# Patient Record
Sex: Male | Born: 1990
Health system: Southern US, Community
[De-identification: ages and names within clinical notes are randomized; demographics above are authoritative.]

## PROBLEM LIST (undated history)

## (undated) DIAGNOSIS — I10 Essential (primary) hypertension: Secondary | ICD-10-CM

## (undated) DIAGNOSIS — E119 Type 2 diabetes mellitus without complications: Secondary | ICD-10-CM

---

## 2010-08-16 ENCOUNTER — Emergency Department (HOSPITAL_COMMUNITY): Admission: EM | Admit: 2010-08-16 | Discharge: 2010-08-16 | Payer: Self-pay | Admitting: Emergency Medicine

## 2013-01-25 ENCOUNTER — Encounter: Payer: Self-pay | Admitting: Medical

## 2013-02-23 ENCOUNTER — Encounter: Payer: Self-pay | Admitting: Medical

## 2015-09-04 ENCOUNTER — Encounter (HOSPITAL_COMMUNITY): Payer: Self-pay | Admitting: *Deleted

## 2015-09-04 ENCOUNTER — Emergency Department (HOSPITAL_COMMUNITY)
Admission: EM | Admit: 2015-09-04 | Discharge: 2015-09-04 | Disposition: A | Discharge status: Home / Self Care | Payer: PRIVATE HEALTH INSURANCE | Attending: Emergency Medicine | Admitting: Emergency Medicine

## 2015-09-04 DIAGNOSIS — H10212 Acute toxic conjunctivitis, left eye: Secondary | ICD-10-CM | POA: Insufficient documentation

## 2015-09-04 DIAGNOSIS — T6591XA Toxic effect of unspecified substance, accidental (unintentional), initial encounter: Secondary | ICD-10-CM | POA: Diagnosis not present

## 2015-09-04 DIAGNOSIS — E119 Type 2 diabetes mellitus without complications: Secondary | ICD-10-CM | POA: Diagnosis not present

## 2015-09-04 DIAGNOSIS — Z77098 Contact with and (suspected) exposure to other hazardous, chiefly nonmedicinal, chemicals: Secondary | ICD-10-CM | POA: Diagnosis present

## 2015-09-04 DIAGNOSIS — I10 Essential (primary) hypertension: Secondary | ICD-10-CM | POA: Insufficient documentation

## 2015-09-04 HISTORY — DX: Essential (primary) hypertension: I10

## 2015-09-04 HISTORY — DX: Type 2 diabetes mellitus without complications: E11.9

## 2015-09-04 MED ORDER — ERYTHROMYCIN 5 MG/GM OP OINT
TOPICAL_OINTMENT | Freq: Three times a day (TID) | OPHTHALMIC | Status: DC
  Administered 2015-09-04: 02:00:00 via OPHTHALMIC
  Filled 2015-09-04: qty 3.5

## 2015-09-04 MED ORDER — PROPARACAINE HCL 0.5 % OP SOLN
1.0000 [drp] | Freq: Once | OPHTHALMIC | Status: AC
  Administered 2015-09-04: 1 [drp] via OPHTHALMIC
  Filled 2015-09-04: qty 15

## 2015-09-04 MED ORDER — FLUORESCEIN SODIUM 1 MG OP STRP
2.0000 | ORAL_STRIP | Freq: Once | OPHTHALMIC | Status: AC
  Administered 2015-09-04: 2 via OPHTHALMIC
  Filled 2015-09-04: qty 2

## 2015-09-04 NOTE — ED Notes (Signed)
Pt states he was shaking the bottle of cleanser to make sure it was something in there and the lid was off and some of the chemical got in his left eye and now his left eye feels scratchy,  This was an abrasive cleanser that is used to clean sinks and toilets.  Pt denies pain,  Just scratchy feeling   ,  Pt did flush his eyes twice,  He is an employee in environmental services here

## 2015-09-04 NOTE — Discharge Instructions (Signed)
Chemical Conjunctivitis Chemical conjunctivitis is an irritation of the underside of the eyelid and the white part of the eye. Conjunctivitis can be caused by infection, allergy or chemical irritation. In your case it has been caused by a chemical irritation of the eye. Symptoms almost always include: tearing, light sensitivity, gritty feeling (sensation) in the eyes, swelling of your eyelids, and often severe pain. In spite of the severe pain, this irritation will run its course and will improve within 24 hours.  HOME CARE INSTRUCTIONS   To ease discomfort apply a cool, clean wash cloth to your eye for 10 to 20 minutes, 3 to 4 times per day.  Do not rub your eyes.  Gently wipe away any discharge from the eyes with moistened tissues.  Wash your hands often with soap and use paper towels to dry.  Sunglasses may be helpful if light bothers your eyes.  Do not use eye make-up.  Do not use contact lenses until the irritation is gone.  Do not operate machinery or drive if your vision is blurred.  Take medications as directed by your caregiver. Artificial tears may ease discomfort.  Avoid the chemical or surroundings which caused the problem. Always use eye protection as necessary. SEEK MEDICAL CARE IF:   The eye is still pink (inflamed) 3 days after beginning treatment.  Pain in the eye increases.  You have discharge coming from either eye.  Your eyelids are stuck together in the morning.  You have an increased sensitivity to light.  An oral temperature above 102 F (38.9 C) develops.  You develop facial pain.  You have any problems that may be related to the medicine you are taking. SEEK IMMEDIATE MEDICAL CARE IF:   Your vision is getting worse.  You develop severe eye pain. MAKE SURE YOU:   Understand these instructions.  Will watch your condition.  Will get help right away if you are not doing well or get worse. Document Released: 09/10/2005 Document Revised:  02/23/2012 Document Reviewed: 07/19/2008 ExitCare Patient Information 2015 [employer]. This information is not intended to replace advice given to you by your health care provider. Make sure you discuss any questions you have with your health care provider.  

## 2015-09-04 NOTE — ED Provider Notes (Signed)
CSN: [id number]     Arrival date & time 09/04/15  0035 History   First MD Initiated Contact with Patient 09/04/15 0047     Chief Complaint  Patient presents with  . Chemical Exposure     (Consider location/radiation/quality/duration/timing/severity/associated sxs/prior Treatment) Patient is a 24 y.o. male presenting with eye injury. The history is provided by the patient. No language interpreter was used.  Eye Injury This is a new problem. The current episode started today. The problem occurs constantly. Pertinent negatives include no congestion. 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 It was flushed at the time of the injury for about 5-6 minutes and he felt better afterward, with less pain but continued tearing. No visual change. .    Past Medical History  Diagnosis Date  . Diabetes mellitus without complication   . Hypertension    History reviewed. No pertinent past surgical history. History reviewed. No pertinent family history. Social History  Substance Use Topics  . Smoking status: Never Smoker   . Smokeless tobacco: None  . Alcohol Use: No    Review of Systems  HENT: Negative for congestion and rhinorrhea.   Eyes: Positive for pain and redness. Negative for photophobia and visual disturbance.      Allergies  Shellfish allergy  Home Medications   Prior to Admission medications   Not on File   BP 141/81 mmHg  Pulse 72  Temp(Src) 98.7 F (37.1 C) (Oral)  Resp 20  Ht  (1.854 m)  Wt 315 lb (142.883 kg)  BMI 41.57 kg/m2  SpO2 96% Physical Exam  Constitutional: He appears well-developed and well-nourished. No distress.  Eyes:  Left eye has conjunctival redness without discharge or swelling. No hyphema. No fluorescein uptake indicating abrasion or ulceration. Left eye pH = 7. Lids unremarkable.   Skin: He is not diaphoretic.    ED  Course  Procedures (including critical care time) Labs Review Labs Reviewed - No data to display  Imaging Review No results found. I have personally reviewed and evaluated these images and lab results as part of my medical decision-making.   EKG Interpretation None      MDM   Final diagnoses:  None    1. Chemical conjunctivitis  Will provide erythromycin ointment to prevent infection. Ophthalmologic referral if symptoms worsen.     Elpidio Anis, PA-C 09/04/15 0201  Azalia Bilis, MD 09/04/15 [phone]

## 2015-09-04 NOTE — ED Notes (Signed)
[place] light and items at bedside

## 2016-02-22 DIAGNOSIS — Z0000 Encounter for general adult medical examination without abnormal findings: Secondary | ICD-10-CM | POA: Diagnosis not present

## 2016-02-22 DIAGNOSIS — E119 Type 2 diabetes mellitus without complications: Secondary | ICD-10-CM | POA: Diagnosis not present

## 2016-02-22 MED FILL — metFORMIN HCL 500 MG TABS: 500 | 30 days supply | Qty: 60 | Fill #0

## 2016-02-28 DIAGNOSIS — Z6841 Body Mass Index (BMI) 40.0 and over, adult: Secondary | ICD-10-CM | POA: Diagnosis not present

## 2016-02-28 DIAGNOSIS — E119 Type 2 diabetes mellitus without complications: Secondary | ICD-10-CM | POA: Diagnosis not present

## 2016-02-28 DIAGNOSIS — Z13220 Encounter for screening for lipoid disorders: Secondary | ICD-10-CM | POA: Diagnosis not present

## 2016-02-28 DIAGNOSIS — Z0000 Encounter for general adult medical examination without abnormal findings: Secondary | ICD-10-CM | POA: Diagnosis not present

## 2016-02-28 DIAGNOSIS — Z7984 Long term (current) use of oral hypoglycemic drugs: Secondary | ICD-10-CM | POA: Diagnosis not present

## 2016-06-29 ENCOUNTER — Emergency Department (HOSPITAL_COMMUNITY)
Admission: EM | Admit: 2016-06-29 | Discharge: 2016-06-29 | Disposition: A | Discharge status: Home / Self Care | Payer: 59 | Attending: Physician Assistant | Admitting: Physician Assistant

## 2016-06-29 ENCOUNTER — Encounter (HOSPITAL_COMMUNITY): Payer: Self-pay

## 2016-06-29 DIAGNOSIS — S3992XA Unspecified injury of lower back, initial encounter: Secondary | ICD-10-CM | POA: Diagnosis present

## 2016-06-29 DIAGNOSIS — Y9389 Activity, other specified: Secondary | ICD-10-CM | POA: Diagnosis not present

## 2016-06-29 DIAGNOSIS — I10 Essential (primary) hypertension: Secondary | ICD-10-CM | POA: Insufficient documentation

## 2016-06-29 DIAGNOSIS — Y92410 Unspecified street and highway as the place of occurrence of the external cause: Secondary | ICD-10-CM | POA: Diagnosis not present

## 2016-06-29 DIAGNOSIS — Y999 Unspecified external cause status: Secondary | ICD-10-CM | POA: Diagnosis not present

## 2016-06-29 DIAGNOSIS — S39012A Strain of muscle, fascia and tendon of lower back, initial encounter: Secondary | ICD-10-CM | POA: Diagnosis not present

## 2016-06-29 DIAGNOSIS — E119 Type 2 diabetes mellitus without complications: Secondary | ICD-10-CM | POA: Insufficient documentation

## 2016-06-29 MED ORDER — CYCLOBENZAPRINE HCL 10 MG PO TABS: 10.0000 mg | ORAL_TABLET | Freq: Two times a day (BID) | ORAL | Status: DC | PRN

## 2016-06-29 MED ORDER — HYDROCODONE-ACETAMINOPHEN 5-325 MG PO TABS: 1.0000 | ORAL_TABLET | ORAL | Status: DC | PRN
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
## 2016-06-29 NOTE — ED Notes (Signed)
PT DISCHARGED. INSTRUCTIONS AND PRESCRIPTIONS GIVEN. AAOX4. PT IN NO APPARENT DISTRESS. THE OPPORTUNITY TO ASK QUESTIONS WAS PROVIDED. 

## 2016-07-10 MED FILL — metFORMIN HCL 500 MG TABS: 500 | 30 days supply | Qty: 60 | Fill #1

## 2016-10-24 DIAGNOSIS — Z7984 Long term (current) use of oral hypoglycemic drugs: Secondary | ICD-10-CM | POA: Diagnosis not present

## 2016-10-24 DIAGNOSIS — E119 Type 2 diabetes mellitus without complications: Secondary | ICD-10-CM | POA: Diagnosis not present

## 2017-01-09 MED FILL — metFORMIN HCL 500 MG TABS: 500 | 90 days supply | Qty: 180 | Fill #0

## 2017-01-19 ENCOUNTER — Ambulatory Visit: Payer: Self-pay | Admitting: Endocrinology

## 2017-02-04 ENCOUNTER — Encounter: Payer: Self-pay | Admitting: Endocrinology

## 2017-02-04 ENCOUNTER — Ambulatory Visit (INDEPENDENT_AMBULATORY_CARE_PROVIDER_SITE_OTHER): Payer: 59 | Admitting: Endocrinology

## 2017-02-04 ENCOUNTER — Other Ambulatory Visit: Payer: Self-pay

## 2017-02-04 VITALS — BP 126/78 | HR 89 | Ht 73.0 in | Wt 272.0 lb

## 2017-02-04 DIAGNOSIS — E1165 Type 2 diabetes mellitus with hyperglycemia: Secondary | ICD-10-CM | POA: Diagnosis not present

## 2017-02-04 DIAGNOSIS — N62 Hypertrophy of breast: Secondary | ICD-10-CM

## 2017-02-04 LAB — POCT GLUCOSE (DEVICE FOR HOME USE): GLUCOSE FASTING, POC: 304 mg/dL — AB (ref 70–99)

## 2017-02-04 LAB — POCT GLYCOSYLATED HEMOGLOBIN (HGB A1C): HEMOGLOBIN A1C: 13.3

## 2017-02-04 MED ORDER — INSULIN GLARGINE-LIXISENATIDE 100-33 UNT-MCG/ML ~~LOC~~ SOPN: 30.0000 [IU] | PEN_INJECTOR | Freq: Every day | SUBCUTANEOUS | 1 refills | Status: DC

## 2017-02-04 MED ORDER — INSULIN PEN NEEDLE 31G X 5 MM MISC: 4 refills | Status: DC

## 2017-02-04 MED ORDER — GLUCOSE BLOOD VI STRP: ORAL_STRIP | 5 refills | Status: DC

## 2017-02-04 MED ORDER — METFORMIN HCL 1000 MG PO TABS: 1000.0000 mg | ORAL_TABLET | Freq: Two times a day (BID) | ORAL | 3 refills | Status: DC

## 2017-02-04 MED ORDER — ACCU-CHEK MULTICLIX LANCETS MISC: 12 refills | Status: DC

## 2017-02-04 MED FILL — metFORMIN HCL 1000 MG TABS: 1000 | 30 days supply | Qty: 60 | Fill #0

## 2017-02-04 NOTE — Progress Notes (Signed)
Patient ID: Danny Crawford, male   DOB: 1991-04-20, 26 y.o.   MRN: [id number]            Reason for Appointment: Consultation for Type 2 Diabetes  Referring physician: Manon Hilding   History of Present Illness:          Date of diagnosis of type 2 diabetes mellitus:        Background history:   At the time of diagnosis she had symptoms of excessive urination, fatigue and blurred vision He thinks he was given an insulin injection once a day but since blood sugars came down soon he was switched to metformin, this was done out of town and no records are available His A1c was 6.3 in 3/17 done by his PCP but he had been irregular with follow-up subsequently  Recent history:    Non-insulin hypoglycemic drugs the patient is taking are: Metformin 500 mg twice a day  Current management, blood sugar patterns and problems identified:  He was referred in 11/17 when his A1c was 14.9 with his PCP but he is now coming for evaluation  He had lost 30-40 pounds late last year but the weight is now leveled off     He says he does not have test strips to check her sugar with his One Touch monitor and has checked his blood sugar sporadically.  Even though he thinks blood sugars last November were over 200 they are not as high now, did not bring his monitor for review today  He has not been following any  specific diet and generally eating large portions and not restricting carbohydrate or fat  Glucose in the office today after a light breakfast was 304  He does not think he has any increased thirst or urination, also is usually drinking sugar-free drinks        Side effects from medications have been: None  Compliance with the medical regimen: Poor  Glucose monitoring:  done <1  times a day         Glucometer: One Touch.       Blood Glucose readings by time of day and averages from recall   PREMEAL Breakfast Lunch Dinner Bedtime  Overall   Glucose range:  160-190  160-190   Median:          Self-care:   Typical meal intake: Breakfast is oatmeal, pancackes Lunch and dinner: Chicken, hamburger, vegetables etc.               Dietician visit, most recent: None               Exercise:  4 times a week at [facility], he is doing some weights and running  Weight history: 271-350, losing since 8/17  Wt Readings from Last 3 Encounters:  02/04/17 272 lb (123.4 kg)  09/04/15 (!) 315 lb (142.9 kg)    Glycemic control:    Lab Results  Component Value Date   HGBA1C 13.3 02/04/2017   No results found for: GLUF, MICROALBUR, LDLCALC, CREATININE No results found for: MICRALBCREAT     Allergies as of 02/04/2017      Reactions   Shellfish Allergy       Medication List       Accurate as of 02/04/17  1:10 PM. Always use your most recent med list.          accu-chek multiclix lancets Use to test blood sugar 2 times daily   glucose blood test strip Commonly known as:  ACCU-CHEK AVIVA PLUS Use to test blood sugar 2 times daily   Insulin Glargine-Lixisenatide 100-33 UNT-MCG/ML Sopn Commonly known as:  SOLIQUA Inject 30 Units into the skin daily before breakfast.   Insulin Pen Needle 31G X 5 MM Misc Use to inject insulin twice daily   metFORMIN 1000 MG tablet Commonly known as:  GLUCOPHAGE Take 1 tablet (1,000 mg total) by mouth 2 (two) times daily with a meal.       Allergies:  Allergies  Allergen Reactions  . Shellfish Allergy     Past Medical History:  Diagnosis Date  . Diabetes mellitus without complication (HCC)   . Hypertension     No past surgical history on file.  Family History  Problem Relation Age of Onset  . Diabetes Father   . Hypertension Father     Social History:  reports that he has quit smoking. He has never used smokeless tobacco. He reports that he drinks alcohol. He reports that he does not use drugs.   Review of Systems  Constitutional: Negative for weight loss and reduced appetite.  Eyes: Negative for blurred vision.   Respiratory: Negative for shortness of breath.   Cardiovascular: Negative for leg swelling.  Gastrointestinal: Negative for nausea and diarrhea.  Endocrine: Negative for fatigue, decreased libido and erectile dysfunction.  Genitourinary: Negative for frequency and nocturia.  Musculoskeletal: Negative for joint pain.  Skin: Negative for rash.  Neurological: Negative for numbness and tingling.  Psychiatric/Behavioral: Negative for insomnia.     Lipid history: No labs available   No results found for: CHOL, HDL, LDLCALC, LDLDIRECT, TRIG, CHOLHDL         Hypertension: Has had previous diagnosis of hypertension but not on treatment now  Most recent eye exam was in 2016  Most recent foot exam: 2/18    LABS:  Office Visit on 02/04/2017  Component Date Value Ref Range Status  . Hemoglobin A1C 02/04/2017 13.3   Final  . Glucose Fasting, POC 02/04/2017 304* 70 - 99 mg/dL Final    Physical Examination:  BP 126/78   Pulse 89   Ht 6\' 1"  (1.854 m)   Wt 272 lb (123.4 kg)   SpO2 95%   BMI 35.89 kg/m   GENERAL:     Well-built and nourished, heavy set, mildly generalized obesity      HEENT:         Eye exam shows normal external appearance. Fundus exam shows no retinopathy.  Oral exam shows normal mucosa .  NECK:   There is no lymphadenopathy  Thyroid is not enlarged and no nodules felt.  Carotids are normal to palpation and no bruit heard CHEST: Bilateral gynecomastia present, more on the left LUNGS:         Chest is symmetrical. Lungs are clear to auscultation.Marland Kitchen   HEART:         Heart sounds:  S1 and S2 are normal. No murmur or click heard., no S3 or S4.   ABDOMEN:   There is no distention present. Liver and spleen are not palpable. No other mass or tenderness present.   NEUROLOGICAL:   Ankle jerks are absent bilaterally.    Diabetic Foot Exam - Simple   Simple Foot Form Diabetic Foot exam was performed with the following findings:  Yes 02/04/2017 12:13 PM  Visual  Inspection No deformities, no ulcerations, no other skin breakdown bilaterally:  Yes Sensation Testing Intact to touch and monofilament testing bilaterally:  Yes Pulse Check Posterior Tibialis and Dorsalis pulse  intact bilaterally:  Yes Comments            Vibration sense is Normal in distal first toes.  MUSCULOSKELETAL:  There is no swelling or deformity of the peripheral joints.  EXTREMITIES:     There is no edema. No skin lesions present.Marland Kitchen SKIN:     he has acanthosis of his neck.     No rash on extremities or lesions of concern.        ASSESSMENT:  Diabetes type 2, uncontrolled    He had apparently symptomatic hyperglycemia at onset requiring insulin and although he was able to get good control with metformin alone a year ago his blood sugars are markedly increased for at least 6 months now Even though he was supposed to be starting insulin last July when his A1c was 14.9 this was not implemented and he has not come for his consultation until now He still has marked hyperglycemia with A1c over 13% although surprisingly asymptomatic His only on low-dose metformin which is not effective  He has no functioning meter at home and his blood sugars reportedly are below 200 which indicates either expired test strips or faulty meter per He does not restrict carbohydrates or fats and his diet Does exercise regularly and has been able to keep his weight down for the last few months  No history of hypertension or hyperlipidemia  Complications of diabetes: None evident  Morbid obesity, gynecomastia, 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  This will provide both basal insulin to reduce glucose toxicity as well as a GLP-1 drug to improve endogenous insulin and help with satiety and weight loss.  Explained to him how GLP-1 drugs work.  Also discussed possible side effects of GLP-1 drugs including nausea and the need to titrate  gradually  Showed him how to use the injection pen device, discussed injection sites and timing of injection to be used before breakfast daily  Discussed action of basal insulin and titration schedule  He will start with 16 units and increase the dose by 2 units every 3 days as long as he has no nausea and try to get morning sugars at least below 150  He needs to start using Accu-Chek monitor which is his company preferred monitor and showed him how to use this as well as need for checking at least once a day at various times  Increased metformin to 1 g twice a day  Discussed blood sugar targets at various times  Cut back on carbohydrates and fat  Consultation with diabetes educator next week  Also have recommended that he signed up for her wellness program with his work although he thinks he is too busy to do this  Patient Instructions    SOLIQUA : This HAS insulin provides blood sugar control for up to 24 hours.   Start with 16 units at am daily and increase by 2 units every 3 days until the waking up sugars are under 150. Then continue the same dose.  If blood sugar is under 100 for 2 days in a row, reduce the dose by 2 units.   Note that this insulin does not control the rise of blood sugar with meals    Check blood sugars on waking up  daily  Also check blood sugars about 2 hours after a meal and do this after different meals by rotation  Recommended blood sugar levels on waking up is  90-130 and about 2 hours after meal is 130-160  Please bring your blood sugar monitor to each visit, thank you    Counseling time on subjects discussed above is over 50% of today's 60 minute visit   Consultation note has been sent to the referring physician  [facility] I[facility] PaKUMAR,Tyshon Fanning 02/04/2017, 1:10 PM   Note: This office note was prepared with Dragon voice recognition system technology. Any transcriptional errors that result from this process are unintentional.

## 2017-02-04 NOTE — Patient Instructions (Signed)
SOLIQUA : This HAS insulin provides blood sugar control for up to 24 hours.   Start with 16 units at am daily and increase by 2 units every 3 days until the waking up sugars are under 150. Then continue the same dose.  If blood sugar is under 100 for 2 days in a row, reduce the dose by 2 units.   Note that this insulin does not control the rise of blood sugar with meals    Check blood sugars on waking up  daily  Also check blood sugars about 2 hours after a meal and do this after different meals by rotation  Recommended blood sugar levels on waking up is 90-130 and about 2 hours after meal is 130-160  Please bring your blood sugar monitor to each visit, thank you

## 2017-02-05 ENCOUNTER — Other Ambulatory Visit: Payer: Self-pay

## 2017-02-05 ENCOUNTER — Other Ambulatory Visit: Payer: Self-pay | Admitting: Endocrinology

## 2017-02-05 MED ORDER — GLUCOSE BLOOD VI STRP: ORAL_STRIP | 5 refills | Status: DC

## 2017-02-05 MED ORDER — FREESTYLE LANCETS MISC: 5 refills | Status: DC

## 2017-02-05 MED ORDER — FREESTYLE LITE DEVI: 1 refills | Status: DC

## 2017-02-05 MED ORDER — INSULIN GLARGINE-LIXISENATIDE 100-33 UNT-MCG/ML ~~LOC~~ SOPN: 30.0000 [IU] | PEN_INJECTOR | Freq: Every day | SUBCUTANEOUS | 1 refills | Status: DC

## 2017-02-05 MED FILL — FREESTYLE LANCETS: 50 days supply | Qty: 100 | Fill #0

## 2017-02-05 MED FILL — FREESTYLE LITE TEST STRIP: 50 days supply | Qty: 100 | Fill #0

## 2017-02-05 MED FILL — FREESTYLE LITE METER: 1 days supply | Qty: 1 | Fill #0

## 2017-02-11 ENCOUNTER — Encounter: Payer: 59 | Attending: Endocrinology | Admitting: Nutrition

## 2017-02-11 DIAGNOSIS — E1165 Type 2 diabetes mellitus with hyperglycemia: Secondary | ICD-10-CM | POA: Insufficient documentation

## 2017-02-11 DIAGNOSIS — Z713 Dietary counseling and surveillance: Secondary | ICD-10-CM | POA: Diagnosis not present

## 2017-02-12 NOTE — Progress Notes (Signed)
Mr. Chimento was shown how to use the NigerSoliqua pen, and we discussed how and when to take this. We also discussed how the  long acting insulin works, as well as how to GLP1 works to lower his blood sugar.  He reported good understanding of all topics He was given some sample 5mm pen needles to use with this.  We dissussed the side effects of this drug and what to do for this. We also discussed the dosage and dosage adjustment schedule. Written instructions were given for 16u, wait 3 days, if FBSs are over 150, increase the dose by 2u.  He will continue to increase the dose by 2u q 3 days until FBS is less than  He reported good understanding of this, and  had no final questions

## 2017-02-12 NOTE — Patient Instructions (Signed)
Take 16u once a day. After 3 days, if morning blood sugars are over 150, increase the dose by 2 units.  Continue to increase the dose by 2units every 3 days until the morning blood sugars are less than 150.

## 2017-02-13 ENCOUNTER — Other Ambulatory Visit: Payer: Self-pay | Admitting: Endocrinology

## 2017-02-13 DIAGNOSIS — E1165 Type 2 diabetes mellitus with hyperglycemia: Secondary | ICD-10-CM

## 2017-02-19 ENCOUNTER — Other Ambulatory Visit: Payer: Self-pay

## 2017-02-19 MED ORDER — INSULIN GLARGINE 100 UNIT/ML SOLOSTAR PEN: 20.0000 [IU] | PEN_INJECTOR | Freq: Every day | SUBCUTANEOUS | 2 refills | Status: DC

## 2017-02-19 MED ORDER — LIRAGLUTIDE 18 MG/3ML ~~LOC~~ SOPN: PEN_INJECTOR | SUBCUTANEOUS | 3 refills | Status: DC

## 2017-02-24 ENCOUNTER — Other Ambulatory Visit (INDEPENDENT_AMBULATORY_CARE_PROVIDER_SITE_OTHER): Payer: 59

## 2017-02-24 DIAGNOSIS — N62 Hypertrophy of breast: Secondary | ICD-10-CM

## 2017-02-24 DIAGNOSIS — E1165 Type 2 diabetes mellitus with hyperglycemia: Secondary | ICD-10-CM

## 2017-02-24 LAB — LIPID PANEL
Cholesterol: 127 mg/dL (ref 0–200)
HDL: 33.6 mg/dL — AB (ref >39.00)
LDL Cholesterol: 84 mg/dL (ref 0–99)
NonHDL: 93
TRIGLYCERIDES: 44 mg/dL (ref 0.0–149.0)
Total CHOL/HDL Ratio: 4
VLDL: 8.8 mg/dL (ref 0.0–40.0)

## 2017-02-24 LAB — BASIC METABOLIC PANEL
BUN: 13 mg/dL (ref 6–23)
CALCIUM: 10 mg/dL (ref 8.4–10.5)
CHLORIDE: 99 meq/L (ref 96–112)
CO2: 31 meq/L (ref 19–32)
CREATININE: 0.88 mg/dL (ref 0.40–1.50)
GFR: 132.81 mL/min (ref >60.00)
GLUCOSE: 254 mg/dL — AB (ref 70–99)
Potassium: 4.2 mEq/L (ref 3.5–5.1)
SODIUM: 135 meq/L (ref 135–145)

## 2017-02-25 LAB — TESTOSTERONE, FREE, TOTAL, SHBG
SEX HORMONE BINDING: 11.7 nmol/L — AB (ref 16.5–55.9)
TESTOSTERONE FREE: 12.6 pg/mL (ref 9.3–26.5)
Testosterone: 270 ng/dL (ref 264–916)

## 2017-02-25 LAB — FRUCTOSAMINE: FRUCTOSAMINE: 488 umol/L — AB (ref 0–285)

## 2017-02-27 ENCOUNTER — Ambulatory Visit: Payer: Self-pay | Admitting: Endocrinology

## 2017-03-06 ENCOUNTER — Ambulatory Visit: Payer: Self-pay | Admitting: Endocrinology

## 2017-03-12 ENCOUNTER — Ambulatory Visit: Payer: Self-pay | Admitting: Endocrinology

## 2017-03-18 ENCOUNTER — Ambulatory Visit: Payer: Self-pay | Admitting: Endocrinology

## 2017-03-19 ENCOUNTER — Encounter: Payer: Self-pay | Admitting: Endocrinology

## 2017-03-19 ENCOUNTER — Ambulatory Visit (INDEPENDENT_AMBULATORY_CARE_PROVIDER_SITE_OTHER): Payer: 59 | Admitting: Endocrinology

## 2017-03-19 VITALS — BP 122/82 | HR 71 | Ht 73.0 in | Wt 282.0 lb

## 2017-03-19 DIAGNOSIS — E1165 Type 2 diabetes mellitus with hyperglycemia: Secondary | ICD-10-CM

## 2017-03-19 MED ORDER — LIRAGLUTIDE 18 MG/3ML ~~LOC~~ SOPN: PEN_INJECTOR | SUBCUTANEOUS | 3 refills | Status: DC

## 2017-03-19 NOTE — Progress Notes (Signed)
Patient ID: Danny Crawford, male   DOB: Jun 19, 1991, 26 y.o.   MRN: [id number]            Reason for Appointment: Follow-up for Type 2 Diabetes  Referring physician: Manon Hilding   History of Present Illness:          Date of diagnosis of type 2 diabetes mellitus:        Background history:   At the time of diagnosis she had symptoms of excessive urination, fatigue and blurred vision He thinks he was given an insulin injection once a day but since blood sugars came down soon he was switched to metformin, this was done out of town and no records are available His A1c was 6.3 in 3/17 done by his PCP but he had been irregular with follow-up subsequently  Recent history:    Non-insulin hypoglycemic drugs the patient is taking are: Metformin 1000 mg twice a day  Current management, blood sugar patterns and problems identified:  He was referred for marked hyperglycemia and A1c of over 13% in blood sugars mostly over 300  He was recommended starting Soliqua but this was not covered by his insurance; he was advised by the nurse educator how to use this  About a month ago he was sent prescriptions for Victoza and Lantus but he claims that he was not called by the pharmacy to pick these up  He has checked only treated blood sugars in the last couple of weeks; mostly checking these readings when he comes back from work overnight and has one reading of 116 and another of 197  Evening reading 177 but his lab glucose was 254  He has gained back some of the weight that he had lost  He was told to start enrolling in the wellness program with his employer but he says he is too busy to do this        Side effects from medications have been: None  Compliance with the medical regimen: Poor  Glucose monitoring:  done <1  times a day         Glucometer:  Freestyle .       Blood Glucose readings by time of day and averages from download as above   Self-care:   Typical meal intake:  Breakfast is oatmeal, pancackes Lunch and dinner: Chicken, hamburger, vegetables etc.               Dietician visit, most recent: None               Exercise:  4 times a week at [facility], he is doing some weights and running  Weight history: 271-350, losing since 8/17  Wt Readings from Last 3 Encounters:  03/19/17 282 lb (127.9 kg)  02/04/17 272 lb (123.4 kg)  09/04/15 (!) 315 lb (142.9 kg)    Glycemic control:    Lab Results  Component Value Date   HGBA1C 13.3 02/04/2017   Lab Results  Component Value Date   LDLCALC 84 02/24/2017   CREATININE 0.88 02/24/2017   No results found for: MICRALBCREAT     Allergies as of 03/19/2017      Reactions   Shellfish Allergy       Medication List       Accurate as of 03/19/17  9:40 AM. Always use your most recent med list.          freestyle lancets Use to test blood sugar twice daily   FREESTYLE LITE Devi Use to  test blood sugar   glucose blood test strip Commonly known as:  FREESTYLE LITE Use to test blood sugar twice daily   Insulin Glargine 100 UNIT/ML Solostar Pen Commonly known as:  LANTUS SOLOSTAR Inject 20 Units into the skin daily at 10 pm.   Insulin Glargine-Lixisenatide 100-33 UNT-MCG/ML Sopn Commonly known as:  SOLIQUA Inject 30 Units into the skin daily before breakfast.   Insulin Pen Needle 31G X 5 MM Misc Use to inject insulin twice daily   liraglutide 18 MG/3ML Sopn Commonly known as:  VICTOZA Inject 1.2 mg daily   metFORMIN 1000 MG tablet Commonly known as:  GLUCOPHAGE Take 1 tablet (1,000 mg total) by mouth 2 (two) times daily with a meal.       Allergies:  Allergies  Allergen Reactions  . Shellfish Allergy     Past Medical History:  Diagnosis Date  . Diabetes mellitus without complication (HCC)   . Hypertension     No past surgical history on file.  Family History  Problem Relation Age of Onset  . Diabetes Father   . Hypertension Father     Social History:  reports that he has  quit smoking. He has never used smokeless tobacco. He reports that he drinks alcohol. He reports that he does not use drugs.   Review of Systems   Lipid history: Has normal lipids HDL low   Lab Results  Component Value Date   CHOL 127 02/24/2017   HDL 33.60 (L) 02/24/2017   LDLCALC 84 02/24/2017   TRIG 44.0 02/24/2017   CHOLHDL 4 02/24/2017           Hypertension: Has had previous diagnosis of hypertension but not on treatment now  Most recent eye exam was in 2016  Most recent foot exam: 2/18    LABS:  No visits with results within 1 Week(s) from this visit.  Latest known visit with results is:  Lab on 02/24/2017  Component Date Value Ref Range Status  . Testosterone 02/24/2017 270  264 - 916 ng/dL Final   Comment: Adult male reference interval is based on a population of healthy nonobese males (BMI <30) between 63 and 20 years old. Travison, et.al. JCEM [phone]. PMID: 29562130.   Marland Kitchen Testosterone, Free 02/24/2017 12.6  9.3 - 26.5 pg/mL Final  . Sex Hormone Binding 02/24/2017 11.7* 16.5 - 55.9 nmol/L Final  . Fructosamine 02/24/2017 488* 0 - 285 umol/L Final   Comment: Published reference interval for apparently healthy subjects between age 65 and 31 is 89 - 285 umol/L and in a poorly controlled diabetic population is 228 - 563 umol/L with a mean of 396 umol/L.   Marland Kitchen Sodium 02/24/2017 135  135 - 145 mEq/L Final  . Potassium 02/24/2017 4.2  3.5 - 5.1 mEq/L Final  . Chloride 02/24/2017 99  96 - 112 mEq/L Final  . CO2 02/24/2017 31  19 - 32 mEq/L Final  . Glucose, Bld 02/24/2017 254* 70 - 99 mg/dL Final  . BUN 86/57/8469 13  6 - 23 mg/dL Final  . Creatinine, Ser 02/24/2017 0.88  0.40 - 1.50 mg/dL Final  . Calcium 62/95/2841 10.0  8.4 - 10.5 mg/dL Final  . GFR 32/44/0102 132.81  >60.00 mL/min Final  . Cholesterol 02/24/2017 127  0 - 200 mg/dL Final  . Triglycerides 02/24/2017 44.0  0.0 - 149.0 mg/dL Final  . HDL 72/53/6644 33.60* >39.00 mg/dL Final  . VLDL  03/47/4259 8.8  0.0 - 40.0 mg/dL Final  . LDL Cholesterol 02/24/2017  84  0 - 99 mg/dL Final  . Total CHOL/HDL Ratio 02/24/2017 4   Final  . NonHDL 02/24/2017 93.00   Final    Physical Examination:  BP 122/82   Pulse 71   Ht  (1.854 m)   Wt 282 lb (127.9 kg)   SpO2 97%   BMI 37.21 kg/m           ASSESSMENT:  Diabetes type 2, uncontrolled    .See history of present illness for detailed discussion of current diabetes management, blood sugar patterns and problems identified  Although he had been asymptomatic with very high blood sugars and A1c of 13 his blood sugars are appearing to be somewhat better overall  However has checked blood sugars very sporadically Fructosamine was 488 about 3 weeks ago He has been quite noncompliant with taking medications as directed, checking his blood sugars or follow-up  PLAN:    Discussed with the patient the nature of GLP-1 drugs, the actions on various organ systems, how they benefit blood glucose control, as well as the benefit of weight loss and  increase satiety . Explained possible side effects especially nausea and vomiting initially; discussed safety information in package insert.  Described the injection technique and dosage titration of Victoza  starting with 0.6 mg once a day at the same time for the first week and then increasing to 1.2 mg if no symptoms of nausea.  Educational brochure on Victoza and co-pay card given  Start checking blood sugars at least once a day at various times, discussed blood sugar targets  Consultation with dietitian  More regular follow-up  There are no Patient Instructions on file for this visit.    Danny Crawford 03/19/2017, 9:40 AM   Note: This office note was prepared with Insurance underwriter. Any transcriptional errors that result from this process are unintentional.

## 2017-03-19 NOTE — Patient Instructions (Signed)
Check blood sugars on waking up  3x weekly  Also check blood sugars about 2 hours after a meal and do this after different meals by rotation  Recommended blood sugar levels on waking up is 90-130 and about 2 hours after meal is 130-160  Please bring your blood sugar monitor to each visit, thank you  Start VICTOZA injection as shown once daily at the same time of the day.  Dial the dose to 0.6 mg on the pen for the first week.  You may inject in the stomach, thigh or arm. You may experience nausea in the first few days which usually goes away.  You will feel fullness of the stomach with starting the medication and should try to keep the portions at meals small.   After 4 days increase the dose to 1.2mg  daily if no nausea present.   If any questions or concerns are present call the office or the Victoza Care helpline at [phone]. Visit [email or web address] for more useful information

## 2017-04-20 ENCOUNTER — Encounter: Payer: Self-pay | Admitting: Dietician

## 2017-04-20 ENCOUNTER — Other Ambulatory Visit: Payer: Self-pay

## 2017-04-23 ENCOUNTER — Ambulatory Visit: Payer: Self-pay | Admitting: Endocrinology

## 2017-04-24 ENCOUNTER — Encounter: Payer: Self-pay | Admitting: Dietician

## 2017-05-07 ENCOUNTER — Encounter: Payer: Self-pay | Admitting: Dietician

## 2017-05-28 ENCOUNTER — Other Ambulatory Visit: Payer: Self-pay

## 2017-06-01 ENCOUNTER — Ambulatory Visit: Payer: Self-pay | Admitting: Endocrinology

## 2017-06-01 DIAGNOSIS — Z0289 Encounter for other administrative examinations: Secondary | ICD-10-CM

## 2017-06-05 ENCOUNTER — Ambulatory Visit: Payer: Self-pay | Admitting: Endocrinology

## 2017-06-24 ENCOUNTER — Ambulatory Visit: Payer: Self-pay | Admitting: Endocrinology

## 2017-06-24 DIAGNOSIS — Z0289 Encounter for other administrative examinations: Secondary | ICD-10-CM

## 2018-10-04 ENCOUNTER — Other Ambulatory Visit: Payer: Self-pay

## 2018-10-04 ENCOUNTER — Encounter (HOSPITAL_COMMUNITY): Payer: Self-pay | Admitting: Emergency Medicine

## 2018-10-04 ENCOUNTER — Emergency Department (HOSPITAL_COMMUNITY)
Admission: EM | Admit: 2018-10-04 | Discharge: 2018-10-04 | Disposition: A | Discharge status: Home / Self Care | Payer: Self-pay | Attending: Emergency Medicine | Admitting: Emergency Medicine

## 2018-10-04 DIAGNOSIS — E119 Type 2 diabetes mellitus without complications: Secondary | ICD-10-CM | POA: Insufficient documentation

## 2018-10-04 DIAGNOSIS — Z794 Long term (current) use of insulin: Secondary | ICD-10-CM | POA: Insufficient documentation

## 2018-10-04 DIAGNOSIS — Z87891 Personal history of nicotine dependence: Secondary | ICD-10-CM | POA: Insufficient documentation

## 2018-10-04 DIAGNOSIS — R0981 Nasal congestion: Secondary | ICD-10-CM

## 2018-10-04 DIAGNOSIS — J302 Other seasonal allergic rhinitis: Secondary | ICD-10-CM

## 2018-10-04 DIAGNOSIS — I10 Essential (primary) hypertension: Secondary | ICD-10-CM | POA: Insufficient documentation

## 2018-10-04 LAB — GROUP A STREP BY PCR: Group A Strep by PCR: NOT DETECTED

## 2018-10-04 NOTE — ED Triage Notes (Signed)
Pt to ER for three weeks URI symptoms. Nasal congestion, sore throat, and dry cough. Pt in NAD.

## 2018-10-04 NOTE — ED Notes (Signed)
ED Provider at bedside. 

## 2018-10-04 NOTE — ED Provider Notes (Signed)
Patient placed in Quick Look pathway, seen and evaluated   Chief Complaint: URI symptoms  HPI: Danny Crawford is a 27 y.o. male who present to the ED with sore throat, dry cough and lots of congestion.   ROS: ENT: sore throat, nasal congestion  Physical Exam:  BP 134/85 (BP Location: Right Arm)   Pulse 91   Temp 98.8 F (37.1 C) (Oral)   Resp 15   SpO2 99%    Gen: No distress  Neuro: Awake and Alert  Skin: Warm and dry  ENT: throat with erythema, uvula midline  Neck: cervical lymph nodes enlarged     Initiation of care has begun. The patient has been counseled on the process, plan, and necessity for staying for the completion/evaluation, and the remainder of the medical screening examination    Janne Napoleon, NP 10/04/18 1420    Tilden Fossa, MD 10/05/18 [phone]

## 2018-10-04 NOTE — Discharge Instructions (Signed)
°  Hand washing: Wash your hands throughout the day, but especially before and after touching the face, using the restroom, sneezing, coughing, or touching surfaces that have been coughed or sneezed upon. Hydration: Symptoms will be intensified and complicated by dehydration. Dehydration can also extend the duration of symptoms. Drink plenty of fluids and get plenty of rest. You should be drinking at least half a liter of water an hour to stay hydrated. Electrolyte drinks (ex. Gatorade, Powerade, Pedialyte) are also encouraged. You should be drinking enough fluids to make your urine light yellow, almost clear. If this is not the case, you are not drinking enough water. Please note that some of the treatments indicated below will not be effective if you are not adequately hydrated. Pain or fever: Ibuprofen, Naproxen, or acetaminophen (generic for Tylenol) for pain or fever.  Zyrtec or Claritin: May add these medication daily to control underlying symptoms of congestion, sneezing, and other signs of allergies.  These medications are available over-the-counter. Generics: Cetirizine (generic for Zyrtec) and loratadine (generic for Claritin). Fluticasone: Use fluticasone (generic for Flonase), as directed, for nasal and sinus congestion.  This medication is available over-the-counter. Congestion: Plain guaifenesin (generic for plain Mucinex) may help relieve congestion. Saline sinus rinses and saline nasal sprays may also help relieve congestion.  Sore throat: Warm liquids or Chloraseptic spray may help soothe a sore throat. Gargle twice a day with a salt water solution made from a half teaspoon of salt in a cup of warm water.  Follow up: Follow up with a primary care provider within the next two weeks should symptoms fail to resolve. Return: Return to the ED for significantly worsening symptoms, shortness of breath, persistent vomiting, large amounts of blood in stool, or any other major concerns.  For  prescription assistance, may try using prescription discount sites or apps, such as goodrx.com

## 2018-10-04 NOTE — ED Provider Notes (Signed)
[facility] EMERGENCY DEPARTMENT Provider Note   CSN: [id number] Arrival date & time: 10/04/18  1401     History   Chief Complaint Chief Complaint  Patient presents with  . Nasal Congestion  . Cough    HPI Danny Crawford is a 27 y.o. male.  HPI   Danny Crawford is a 27 y.o. male, with a history of DM and HTN, presenting to the ED with nasal congestion, rhinorrhea, and occasional sore throat and dry cough for the last 3 weeks.  He has tried taking Tylenol. Denies fever/chills, shortness of breath, chest pain, N/V/D, or any other complaints.     Past Medical History:  Diagnosis Date  . Diabetes mellitus without complication (HCC)   . Hypertension     Patient Active Problem List   Diagnosis Date Noted  . Uncontrolled type 2 diabetes mellitus with hyperglycemia, without long-term current use of insulin (HCC) 02/04/2017    History reviewed. No pertinent surgical history.      Home Medications    Prior to Admission medications   Medication Sig Start Date End Date Taking? Authorizing Provider  Blood Glucose Monitoring Suppl (FREESTYLE LITE) DEVI Use to test blood sugar 02/05/17   Reather Littler, MD  glucose blood (FREESTYLE LITE) test strip Use to test blood sugar twice daily 02/05/17   Reather Littler, MD  Insulin Glargine (LANTUS SOLOSTAR) 100 UNIT/ML Solostar Pen Inject 20 Units into the skin daily at 10 pm. Patient not taking: Reported on 03/19/2017 02/19/17   Reather Littler, MD  Insulin Pen Needle 31G X 5 MM MISC Use to inject insulin twice daily 02/04/17   Reather Littler, MD  Lancets (FREESTYLE) lancets Use to test blood sugar twice daily 02/05/17   Reather Littler, MD  liraglutide (VICTOZA) 18 MG/3ML SOPN Inject 1.2 mg daily 03/19/17   Reather Littler, MD  metFORMIN (GLUCOPHAGE) 1000 MG tablet Take 1 tablet (1,000 mg total) by mouth 2 (two) times daily with a meal. 02/04/17   Reather Littler, MD    Family History Family History  Problem Relation Age of Onset  . Diabetes  Father   . Hypertension Father     Social History Social History   Tobacco Use  . Smoking status: Former Games developer  . Smokeless tobacco: Never Used  Substance Use Topics  . Alcohol use: Yes    Comment: socially  . Drug use: No     Allergies   Shellfish allergy   Review of Systems Review of Systems  Constitutional: Negative for chills, diaphoresis and fever.  HENT: Positive for congestion, rhinorrhea, sneezing and sore throat. Negative for sinus pressure, sinus pain and trouble swallowing.   Respiratory: Positive for cough. Negative for shortness of breath.   Cardiovascular: Negative for chest pain.  Gastrointestinal: Negative for abdominal pain, diarrhea, nausea and vomiting.  Neurological: Negative for dizziness.  All other systems reviewed and are negative.    Physical Exam Updated Vital Signs BP 134/85 (BP Location: Right Arm)   Pulse 91   Temp 98.8 F (37.1 C) (Oral)   Resp 15   SpO2 99%   Physical Exam  Constitutional: He appears well-developed and well-nourished. No distress.  HENT:  Head: Normocephalic and atraumatic.  Nose: Mucosal edema and rhinorrhea present.  Mouth/Throat: Uvula is midline, oropharynx is clear and moist and mucous membranes are normal.  Eyes: Conjunctivae are normal.  Neck: Neck supple.  Cardiovascular: Normal rate, regular rhythm and intact distal pulses.  Pulmonary/Chest: Effort normal and breath sounds normal. No respiratory  distress.  Abdominal: There is no guarding.  Musculoskeletal: He exhibits no edema.  Lymphadenopathy:    He has no cervical adenopathy.  Neurological: He is alert.  Skin: Skin is warm and dry. He is not diaphoretic.  Psychiatric: He has a normal mood and affect. His behavior is normal.  Nursing note and vitals reviewed.    ED Treatments / Results  Labs (all labs ordered are listed, but only abnormal results are displayed) Labs Reviewed  GROUP A STREP BY PCR    EKG None  Radiology No results  found.  Procedures Procedures (including critical care time)  Medications Ordered in ED Medications - No data to display   Initial Impression / Assessment and Plan / ED Course  I have reviewed the triage vital signs and the nursing notes.  Pertinent labs & imaging results that were available during my care of the patient were reviewed by me and considered in my medical decision making (see chart for details).     Patient presents with 3 weeks of congestion.  Symptoms are suggestive of seasonal or environmental allergies. The patient was given instructions for home care as well as return precautions. Patient voices understanding of these instructions, accepts the plan, and is comfortable with discharge.     Final Clinical Impressions(s) / ED Diagnoses   Final diagnoses:  Nasal congestion  Seasonal allergies    ED Discharge Orders    None       Concepcion Living 10/04/18 1725    Vanetta Mulders, MD 10/04/18 1736

## 2019-05-04 ENCOUNTER — Emergency Department (HOSPITAL_COMMUNITY)
Admission: EM | Admit: 2019-05-04 | Discharge: 2019-05-04 | Disposition: A | Discharge status: Home / Self Care | Payer: Managed Care, Other (non HMO) | Attending: Emergency Medicine | Admitting: Emergency Medicine

## 2019-05-04 ENCOUNTER — Emergency Department (INDEPENDENT_AMBULATORY_CARE_PROVIDER_SITE_OTHER): Payer: Managed Care, Other (non HMO)

## 2019-05-04 ENCOUNTER — Encounter (HOSPITAL_COMMUNITY): Payer: Self-pay | Admitting: Emergency Medicine

## 2019-05-04 ENCOUNTER — Emergency Department (HOSPITAL_COMMUNITY): Payer: Managed Care, Other (non HMO)

## 2019-05-04 DIAGNOSIS — Z794 Long term (current) use of insulin: Secondary | ICD-10-CM | POA: Diagnosis not present

## 2019-05-04 DIAGNOSIS — R079 Chest pain, unspecified: Secondary | ICD-10-CM

## 2019-05-04 DIAGNOSIS — I10 Essential (primary) hypertension: Secondary | ICD-10-CM | POA: Diagnosis not present

## 2019-05-04 DIAGNOSIS — Z79899 Other long term (current) drug therapy: Secondary | ICD-10-CM | POA: Insufficient documentation

## 2019-05-04 DIAGNOSIS — R072 Precordial pain: Secondary | ICD-10-CM | POA: Diagnosis not present

## 2019-05-04 DIAGNOSIS — Z9189 Other specified personal risk factors, not elsewhere classified: Secondary | ICD-10-CM

## 2019-05-04 DIAGNOSIS — Z1159 Encounter for screening for other viral diseases: Secondary | ICD-10-CM | POA: Diagnosis not present

## 2019-05-04 DIAGNOSIS — Z87891 Personal history of nicotine dependence: Secondary | ICD-10-CM | POA: Insufficient documentation

## 2019-05-04 DIAGNOSIS — E119 Type 2 diabetes mellitus without complications: Secondary | ICD-10-CM | POA: Diagnosis not present

## 2019-05-04 DIAGNOSIS — M79622 Pain in left upper arm: Secondary | ICD-10-CM | POA: Diagnosis present

## 2019-05-04 LAB — CBC
HCT: 48.6 % (ref 39.0–52.0)
Hemoglobin: 16.3 g/dL (ref 13.0–17.0)
MCH: 29.3 pg (ref 26.0–34.0)
MCHC: 33.5 g/dL (ref 30.0–36.0)
MCV: 87.4 fL (ref 80.0–100.0)
Platelets: 264 10*3/uL (ref 150–400)
RBC: 5.56 MIL/uL (ref 4.22–5.81)
RDW: 11.7 % (ref 11.5–15.5)
WBC: 6.2 10*3/uL (ref 4.0–10.5)
nRBC: 0 % (ref 0.0–0.2)

## 2019-05-04 LAB — RAPID URINE DRUG SCREEN, HOSP PERFORMED
Amphetamines: NOT DETECTED
Barbiturates: NOT DETECTED
Benzodiazepines: NOT DETECTED
Cocaine: NOT DETECTED
Opiates: NOT DETECTED
Tetrahydrocannabinol: NOT DETECTED

## 2019-05-04 LAB — BASIC METABOLIC PANEL
Anion gap: 11 (ref 5–15)
BUN: 10 mg/dL (ref 6–20)
CO2: 24 mmol/L (ref 22–32)
Calcium: 9.5 mg/dL (ref 8.9–10.3)
Chloride: 100 mmol/L (ref 98–111)
Creatinine, Ser: 1 mg/dL (ref 0.61–1.24)
GFR calc Af Amer: >60 mL/min (ref >60)
GFR calc non Af Amer: >60 mL/min (ref >60)
Glucose, Bld: 335 mg/dL — ABNORMAL HIGH (ref 70–99)
Potassium: 4.3 mmol/L (ref 3.5–5.1)
Sodium: 135 mmol/L (ref 135–145)

## 2019-05-04 LAB — TROPONIN I
Troponin I: <0.03 ng/mL (ref <0.03)
Troponin I: <0.03 ng/mL (ref <0.03)

## 2019-05-04 LAB — ECHOCARDIOGRAM COMPLETE
Height: 74 in
Weight: 3856 oz

## 2019-05-04 LAB — SARS CORONAVIRUS 2 BY RT PCR (HOSPITAL ORDER, PERFORMED IN ~~LOC~~ HOSPITAL LAB): SARS Coronavirus 2: NEGATIVE

## 2019-05-04 NOTE — ED Triage Notes (Addendum)
Left arm pain that moved to his right went to [facility]  Had ekg and it was abnormal senty for further tests had cp lasted a couple of hours

## 2019-05-04 NOTE — ED Notes (Signed)
Paged cardio-[facility] to Dr Donnald Garre

## 2019-05-04 NOTE — Discharge Instructions (Addendum)
1.  At this time your echocardiogram does not show any significant problems.  Your heart labs do not show a heart attack.  You do however need to follow-up with your family doctor for continued monitoring of chest pain.  You do have risk factors for early heart disease and this should be carefully monitored. 2.  Return to the emergency department if you get new, worsening or changing symptoms.

## 2019-05-04 NOTE — ED Notes (Signed)
EDP explained tests results and plan of care to pt. at bedside.

## 2019-05-04 NOTE — ED Provider Notes (Signed)
[facility]ERGENCY [facility]PARTMENT Provider Note   CSN: [id number] Arrival date & time: 05/04/19  1209    History   Chief Complaint Chief Complaint  Patient presents with  . Arm Pain    HPI Danny Brownsnthony Olano is a 28 y.o. male.  He has a history of hypertension and diabetes.  He is complaining of some on and off chest pain and left arm pain that has been going on intermittently for a few days.  Says it last for hours at a time.  Says it feels like a cramp or pressure.  He says he feels hot when he gets up but is not diaphoretic or short of breath or nauseous.  He said the symptoms he was younger.  He denies smoking or any street drugs.  No history of cardiac disease no close family history of cardiac disease.  He went to urgent care where he had an abnormal EKG and they sent him here for further evaluation.     The history is provided by the patient.  Arm Pain  The current episode started more than 2 days ago. The problem occurs daily. The problem has not changed since onset.Associated symptoms include chest pain. Pertinent negatives include no abdominal pain, no headaches and no shortness of breath. Nothing aggravates the symptoms. Nothing relieves the symptoms. He has tried nothing for the symptoms. The treatment provided no relief.    Past Medical History:  Diagnosis Date  . Diabetes mellitus without complication (HCC)   . Hypertension     Patient Active Problem List   Diagnosis Date Noted  . Uncontrolled type 2 diabetes mellitus with hyperglycemia, without long-term current use of insulin (HCC) 02/04/2017    History reviewed. No pertinent surgical history.      Home Medications    Prior to Admission medications   Medication Sig Start Date End Date Taking? Authorizing Provider  Blood Glucose Monitoring Suppl (FREESTYLE LITE) DEVI Use to test blood sugar 02/05/17   Reather LittlerKumar, Ajay, MD  glucose blood (FREESTYLE LITE) test strip Use to test blood sugar twice daily  02/05/17   Reather LittlerKumar, Ajay, MD  Insulin Glargine (LANTUS SOLOSTAR) 100 UNIT/ML Solostar Pen Inject 20 Units into the skin daily at 10 pm. Patient not taking: Reported on 03/19/2017 02/19/17   Reather LittlerKumar, Ajay, MD  Insulin Pen Needle 31G X 5 MM MISC Use to inject insulin twice daily 02/04/17   Reather LittlerKumar, Ajay, MD  Lancets (FREESTYLE) lancets Use to test blood sugar twice daily 02/05/17   Reather LittlerKumar, Ajay, MD  liraglutide (VICTOZA) 18 MG/3ML SOPN Inject 1.2 mg daily 03/19/17   Reather LittlerKumar, Ajay, MD  metFORMIN (GLUCOPHAGE) 1000 MG tablet Take 1 tablet (1,000 mg total) by mouth 2 (two) times daily with a meal. 02/04/17   Reather LittlerKumar, Ajay, MD    Family History Family History  Problem Relation Age of Onset  . Diabetes Father   . Hypertension Father     Social History Social History   Tobacco Use  . Smoking status: Former Games developermoker  . Smokeless tobacco: Never Used  Substance Use Topics  . Alcohol use: Yes    Comment: socially  . Drug use: No     Allergies   Shellfish allergy   Review of Systems Review of Systems  Constitutional: Negative for fever.  HENT: Negative for sore throat.   Eyes: Negative for visual disturbance.  Respiratory: Negative for shortness of breath.   Cardiovascular: Positive for chest pain.  Gastrointestinal: Negative for abdominal pain.  Genitourinary: Negative  for dysuria.  Musculoskeletal: Negative for neck pain.  Skin: Negative for rash.  Neurological: Negative for headaches.     Physical Exam Updated Vital Signs BP (!) 137/95 (BP Location: Right Arm)   Pulse 80   Temp 98.3 F (36.8 C) (Oral)   Resp 16   Ht  (1.88 m)   Wt 109.3 kg   SpO2 99%   BMI 30.94 kg/m   Physical Exam Vitals signs and nursing note reviewed.  Constitutional:      Appearance: He is well-developed.  HENT:     Head: Normocephalic and atraumatic.  Eyes:     Conjunctiva/sclera: Conjunctivae normal.  Neck:     Musculoskeletal: Normal range of motion and neck supple.  Cardiovascular:     Rate and  Rhythm: Normal rate and regular rhythm.     Pulses: Normal pulses.     Heart sounds: No murmur.  Pulmonary:     Effort: Pulmonary effort is normal. No respiratory distress.     Breath sounds: Normal breath sounds.  Abdominal:     Palpations: Abdomen is soft.     Tenderness: There is no abdominal tenderness.  Musculoskeletal: Normal range of motion.        General: No tenderness or signs of injury.     Right lower leg: No edema.     Left lower leg: No edema.  Skin:    General: Skin is warm and dry.  Neurological:     General: No focal deficit present.     Mental Status: He is alert and oriented to person, place, and time.     Sensory: No sensory deficit.     Motor: No weakness.     Gait: Gait normal.      ED Treatments / Results  Labs (all labs ordered are listed, but only abnormal results are displayed) Labs Reviewed  BASIC METABOLIC PANEL - Abnormal; Notable for the following components:      Result Value   Glucose, Bld 335 (*)    All other components within normal limits  SARS CORONAVIRUS 2 (HOSPITAL ORDER, PERFORMED IN [facility])  CBC  TROPONIN I  RAPID URINE DRUG SCREEN, HOSP PERFORMED  TROPONIN I    EKG EKG Interpretation  Date/Time:  Wednesday May 04 2019 12:37:59 EDT Ventricular Rate:  85 PR Interval:    QRS Duration: 82 QT Interval:  328 QTC Calculation: 390 R Axis:   82 Text Interpretation:  Sinus rhythm Diffuse ST elevations Confirmed by Meridee Score [phone]) on 05/04/2019 12:53:08 PM   Radiology Dg Chest Port 1 View  Result Date: 05/04/2019 CLINICAL DATA:  Left chest tightness. EXAM: PORTABLE CHEST 1 VIEW COMPARISON:  None. FINDINGS: The heart size and mediastinal contours are within normal limits. Both lungs are clear. The visualized skeletal structures are unremarkable. IMPRESSION: No active disease. Electronically Signed   By: Sherian Rein M.D.   On: 05/04/2019 13:03    Procedures Procedures (including critical care time)   Medications Ordered in ED Medications - No data to display   Initial Impression / Assessment and Plan / ED Course  I have reviewed the triage vital signs and the nursing notes.  Pertinent labs & imaging results that were available during my care of the patient were reviewed by me and considered in my medical decision making (see chart for details).  Clinical Course as of May 04 1614  Wed May 04, 2019  2540 28 year old male here with intermittent substernal chest pain and arm  pain.  He appears very nontoxic and has a normal exam.  He was sent from urgent care for ST elevations.  He says his pain is minimal at the moment.  Differential diagnosis includes early repolarization, LVH, musculoskeletal pain, reflux, ACS, pneumothorax.   [MB]  1256 Due to the patient's abnormal EKGs I reviewed them with cardiology Dr. Jacques Navy.  She agrees there abnormal appearing and recommends troponins as we are doing along with getting a cardiac echo.   [MB]  1352 Patient's for stroke back and negative.  He looks very comfortable.  I updated him on the plan for getting a echo   [MB]  1510 Reportedly echo will not do the patient until his Covid test is resulted.  If they leave the hospital prior to testing results the order will need to be replaced and ordered as a stat.   [MB]  2037 Consult: Reviewed with Dr. Okey Dupre on-call fellow, he will review the echo images for a verbal interpretation.   [MP]    Clinical Course User Index [MB] Terrilee Files, MD [MP] Arby Barrette, MD      Danny Crawford was evaluated in Emergency Department on 05/05/2019 for the symptoms described in the history of present illness. He was evaluated in the context of the global COVID-19 pandemic, which necessitated consideration that the patient might be at risk for infection with the SARS-CoV-2 virus that causes COVID-19. Institutional protocols and algorithms that pertain to the evaluation of patients at risk for COVID-19 are in a  state of rapid change based on information released by regulatory bodies including the CDC and federal and state organizations. These policies and algorithms were followed during the patient's care in the ED.   Final Clinical Impressions(s) / ED Diagnoses   Final diagnoses:  Precordial pain  Cardiovascular risk factor    ED Discharge Orders    None       Terrilee Files, MD 05/05/19 1616

## 2019-05-04 NOTE — Progress Notes (Signed)
  Echocardiogram 2D Echocardiogram has been performed.  Danny Crawford 05/04/2019, 5:11 PM

## 2019-05-04 NOTE — ED Provider Notes (Signed)
ECHO after COVID returned. Cardiology consulted Dr. Lucita Lora, reviewed EKG, suspected early repolarization.  Physical Exam  BP 129/79 (BP Location: Right Arm)   Pulse 90   Temp 98.3 F (36.8 C) (Oral)   Resp 16   Ht 6\' 2"  (1.88 m)   Wt 109.3 kg   SpO2 98%   BMI 30.94 kg/m   Physical Exam  ED Course/Procedures   Clinical Course as of May 03 2110  Wed May 04, 2019  8249 28 year old male here with intermittent substernal chest pain and arm pain.  He appears very nontoxic and has a normal exam.  He was sent from urgent care for ST elevations.  He says his pain is minimal at the moment.  Differential diagnosis includes early repolarization, LVH, musculoskeletal pain, reflux, ACS, pneumothorax.   [MB]  1256 Due to the patient's abnormal EKGs I reviewed them with cardiology Dr. Jacques Navy.  She agrees there abnormal appearing and recommends troponins as we are doing along with getting a cardiac echo.   [MB]  1352 Patient's for stroke back and negative.  He looks very comfortable.  I updated him on the plan for getting a echo   [MB]  1510 Reportedly echo will not do the patient until his Covid test is resulted.  If they leave the hospital prior to testing results the order will need to be replaced and ordered as a stat.   [MB]  2037 Consult: Reviewed with Dr. Okey Dupre on-call fellow, he will review the echo images for a verbal interpretation.   [MP]    Clinical Course User Index [MB] Terrilee Files, MD [MP] Arby Barrette, MD    Procedures  MDM  2 sets of cardiac enzymes are negative.  Echo has been reviewed by fellow for the evening.  No acute findings present.  Formal read will be pending.  Patient is clinically well in appearance.  He is not having exertional chest pain or shortness of breath.  At this time stable for discharge with close follow-up with PCP.      Arby Barrette, MD 05/04/19 2112

## 2019-05-26 ENCOUNTER — Emergency Department (HOSPITAL_COMMUNITY): Payer: Managed Care, Other (non HMO)

## 2019-05-26 ENCOUNTER — Other Ambulatory Visit: Payer: Self-pay

## 2019-05-26 ENCOUNTER — Emergency Department (HOSPITAL_COMMUNITY)
Admission: EM | Admit: 2019-05-26 | Discharge: 2019-05-26 | Disposition: A | Discharge status: Home / Self Care | Payer: Managed Care, Other (non HMO) | Attending: Emergency Medicine | Admitting: Emergency Medicine

## 2019-05-26 DIAGNOSIS — M6281 Muscle weakness (generalized): Secondary | ICD-10-CM | POA: Diagnosis not present

## 2019-05-26 DIAGNOSIS — R51 Headache: Secondary | ICD-10-CM | POA: Diagnosis not present

## 2019-05-26 DIAGNOSIS — I10 Essential (primary) hypertension: Secondary | ICD-10-CM | POA: Diagnosis not present

## 2019-05-26 DIAGNOSIS — Z87891 Personal history of nicotine dependence: Secondary | ICD-10-CM | POA: Insufficient documentation

## 2019-05-26 DIAGNOSIS — R55 Syncope and collapse: Secondary | ICD-10-CM | POA: Diagnosis not present

## 2019-05-26 DIAGNOSIS — R531 Weakness: Secondary | ICD-10-CM

## 2019-05-26 DIAGNOSIS — E119 Type 2 diabetes mellitus without complications: Secondary | ICD-10-CM | POA: Diagnosis not present

## 2019-05-26 LAB — COMPREHENSIVE METABOLIC PANEL
ALT: 24 U/L (ref 0–44)
AST: 19 U/L (ref 15–41)
Albumin: 3.8 g/dL (ref 3.5–5.0)
Alkaline Phosphatase: 62 U/L (ref 38–126)
Anion gap: 11 (ref 5–15)
BUN: 9 mg/dL (ref 6–20)
CO2: 23 mmol/L (ref 22–32)
Calcium: 9.3 mg/dL (ref 8.9–10.3)
Chloride: 102 mmol/L (ref 98–111)
Creatinine, Ser: 0.89 mg/dL (ref 0.61–1.24)
GFR calc Af Amer: >60 mL/min (ref >60)
GFR calc non Af Amer: >60 mL/min (ref >60)
Glucose, Bld: 330 mg/dL — ABNORMAL HIGH (ref 70–99)
Potassium: 3.9 mmol/L (ref 3.5–5.1)
Sodium: 136 mmol/L (ref 135–145)
Total Bilirubin: 0.9 mg/dL (ref 0.3–1.2)
Total Protein: 6.8 g/dL (ref 6.5–8.1)

## 2019-05-26 LAB — CBC
HCT: 42.6 % (ref 39.0–52.0)
Hemoglobin: 14.2 g/dL (ref 13.0–17.0)
MCH: 29.4 pg (ref 26.0–34.0)
MCHC: 33.3 g/dL (ref 30.0–36.0)
MCV: 88.2 fL (ref 80.0–100.0)
Platelets: 250 10*3/uL (ref 150–400)
RBC: 4.83 MIL/uL (ref 4.22–5.81)
RDW: 12 % (ref 11.5–15.5)
WBC: 6.6 10*3/uL (ref 4.0–10.5)
nRBC: 0 % (ref 0.0–0.2)

## 2019-05-26 LAB — RAPID URINE DRUG SCREEN, HOSP PERFORMED
Amphetamines: NOT DETECTED
Barbiturates: NOT DETECTED
Benzodiazepines: NOT DETECTED
Cocaine: NOT DETECTED
Opiates: NOT DETECTED
Tetrahydrocannabinol: NOT DETECTED

## 2019-05-26 LAB — ETHANOL: Alcohol, Ethyl (B): <10 mg/dL (ref <10)

## 2019-05-26 LAB — CBG MONITORING, ED: Glucose-Capillary: 296 mg/dL — ABNORMAL HIGH (ref 70–99)

## 2019-05-26 NOTE — Discharge Instructions (Addendum)
Your symptoms have resolved over time and your lab/x-ray studies here are all normal. Follow up with your doctor for recheck if symptoms recur.

## 2019-05-26 NOTE — ED Triage Notes (Signed)
Pt BIB [facility] d/t witnessed syncope episode per Pt's wife. Pt slid down to ground and passed out. Per EMS d/t had minor tremor-like activity and dry heaving.

## 2019-05-26 NOTE — ED Notes (Signed)
Pt ambulated in hall with steady gate.

## 2019-05-26 NOTE — ED Provider Notes (Signed)
[facility] Provider Note   CSN: [id number] Arrival date & time: 05/26/19  0121     History   Chief Complaint Chief Complaint  Patient presents with  . Loss of Consciousness    HPI Danny Crawford is a 28 y.o. male.     Patient to ED after passing out earlier this evening and upon waking has been unable to move his extremities. He reports feeling numb. He reports he does not remember the events of this evening. No nausea, vomiting, SOB, chest pain. No recent illness. He complains of headache. No visual changes. He denies alcohol or drugs. No history of similar symptoms. Per EMS, the patient was out with his girlfriend when his wife approached them. He then passed out and was brought to the ED.   The history is provided by the patient and the EMS personnel. No language interpreter was used.  Loss of Consciousness Associated symptoms: headaches and weakness   Associated symptoms: no fever and no nausea     Past Medical History:  Diagnosis Date  . Diabetes mellitus without complication (HCC)   . Hypertension     Patient Active Problem List   Diagnosis Date Noted  . Uncontrolled type 2 diabetes mellitus with hyperglycemia, without long-term current use of insulin (HCC) 02/04/2017    No past surgical history on file.      Home Medications    Prior to Admission medications   Medication Sig Start Date End Date Taking? Authorizing Provider  Blood Glucose Monitoring Suppl (FREESTYLE LITE) DEVI Use to test blood sugar 02/05/17   Reather LittlerKumar, Ajay, MD  glucose blood (FREESTYLE LITE) test strip Use to test blood sugar twice daily 02/05/17   Reather LittlerKumar, Ajay, MD  Insulin Glargine (LANTUS SOLOSTAR) 100 UNIT/ML Solostar Pen Inject 20 Units into the skin daily at 10 pm. Patient not taking: Reported on 05/04/2019 02/19/17   Reather LittlerKumar, Ajay, MD  Insulin Pen Needle 31G X 5 MM MISC Use to inject insulin twice daily 02/04/17   Reather LittlerKumar, Ajay, MD  Lancets (FREESTYLE)  lancets Use to test blood sugar twice daily 02/05/17   Reather LittlerKumar, Ajay, MD  liraglutide (VICTOZA) 18 MG/3ML SOPN Inject 1.2 mg daily Patient not taking: Reported on 05/04/2019 03/19/17   Reather LittlerKumar, Ajay, MD  metFORMIN (GLUCOPHAGE) 1000 MG tablet Take 1 tablet (1,000 mg total) by mouth 2 (two) times daily with a meal. Patient not taking: Reported on 05/04/2019 02/04/17   Reather LittlerKumar, Ajay, MD  Multiple Vitamins-Minerals (MULTIVITAMIN GUMMIES ADULTS) CHEW Chew 2 tablets by mouth daily.    [provider]    Family History Family History  Problem Relation Age of Onset  . Diabetes Father   . Hypertension Father     Social History Social History   Tobacco Use  . Smoking status: Former Games developermoker  . Smokeless tobacco: Never Used  Substance Use Topics  . Alcohol use: Yes    Comment: socially  . Drug use: No     Allergies   Shellfish allergy   Review of Systems Review of Systems  Constitutional: Negative for chills and fever.  HENT: Negative.   Eyes: Negative for visual disturbance.  Respiratory: Negative.   Cardiovascular: Positive for syncope.  Gastrointestinal: Negative.  Negative for nausea.  Musculoskeletal: Negative.   Skin: Negative.   Neurological: Positive for weakness, numbness and headaches.     Physical Exam Updated Vital Signs BP 134/85   Pulse 86   Temp 98.5 F (36.9 C) (Oral)   Resp  12   SpO2 97%   Physical Exam Vitals signs and nursing note reviewed.  Constitutional:      General: He is not in acute distress.    Appearance: He is well-developed.  HENT:     Head: Normocephalic and atraumatic.  Neck:     Musculoskeletal: Normal range of motion and neck supple.  Cardiovascular:     Rate and Rhythm: Normal rate and regular rhythm.  Pulmonary:     Effort: Pulmonary effort is normal.     Breath sounds: Normal breath sounds.  Abdominal:     General: Bowel sounds are normal.     Palpations: Abdomen is soft.     Tenderness: There is no abdominal tenderness.  There is no guarding or rebound.  Musculoskeletal:        General: No swelling, deformity or signs of injury.  Skin:    General: Skin is warm and dry.     Findings: No rash.  Neurological:     Mental Status: He is alert and oriented to person, place, and time.     Comments: Unconscious on arrival, unresponsive to sternal rub, wakes with ammonia pellet and remains awake. Reflexes are hyporeflexic but symmetric. There is decreased sensation to extremities x 4. Muscular tone exam is inconsistent: states unable to move LE's but lifts his legs back into bed; states can't move UE's but grips tech's hand when helped to a sitting position.       ED Treatments / Results  Labs (all labs ordered are listed, but only abnormal results are displayed) Labs Reviewed - No data to display  EKG    Radiology No results found.  Procedures Procedures (including critical care time)  Medications Ordered in ED Medications - No data to display   Initial Impression / Assessment and Plan / ED Course  I have reviewed the triage vital signs and the nursing notes.  Pertinent labs & imaging results that were available during my care of the patient were reviewed by me and considered in my medical decision making (see chart for details).        The patient arrives by EMS after a syncopal episode. Unconscious on arrival, unresponsive to sternal rub. Eyes flinch when touched. He wakes with ammonia pellet and remains awake.   Exam and history supports non-physiologic cause to patient's symptoms. Will observe over time, check labs, head ct.   Labs, head CT negative for acute finding. Discussed symptoms with the patient and what was going on when it started. When asked if he thought it was contributing to symptoms he acknowledges "it might".   Patient updated when labs and CT are resulted. He is reassured that there is no physical reason he is having difficulty moving and that we would attempt to ambulate. He  agrees.   The patient is able to ambulate, moves all extremities, requires no assistance. He is felt stable for discharge.  Final Clinical Impressions(s) / ED Diagnoses   Final diagnoses:  None   1. Weakness  ED Discharge Orders    None       Charlann Lange, Hershal Coria 05/27/19 5916    Veryl Speak, MD 05/28/19 [phone]

## 2019-07-27 ENCOUNTER — Ambulatory Visit: Payer: Managed Care, Other (non HMO) | Admitting: *Deleted

## 2019-12-19 ENCOUNTER — Emergency Department (HOSPITAL_COMMUNITY)
Admission: EM | Admit: 2019-12-19 | Discharge: 2019-12-20 | Discharge status: Against Medical Advice | Payer: Managed Care, Other (non HMO)

## 2019-12-19 ENCOUNTER — Other Ambulatory Visit: Payer: Self-pay

## 2020-03-30 ENCOUNTER — Encounter: Payer: Self-pay | Admitting: Primary Care

## 2020-03-30 ENCOUNTER — Other Ambulatory Visit: Payer: Self-pay

## 2020-03-30 ENCOUNTER — Ambulatory Visit (INDEPENDENT_AMBULATORY_CARE_PROVIDER_SITE_OTHER): Payer: Managed Care, Other (non HMO) | Admitting: Primary Care

## 2020-03-30 VITALS — BP 130/86 | HR 84 | Temp 96.9°F | Ht 73.0 in | Wt 230.2 lb

## 2020-03-30 DIAGNOSIS — E1165 Type 2 diabetes mellitus with hyperglycemia: Secondary | ICD-10-CM | POA: Diagnosis not present

## 2020-03-30 DIAGNOSIS — E119 Type 2 diabetes mellitus without complications: Secondary | ICD-10-CM | POA: Diagnosis not present

## 2020-03-30 LAB — LIPID PANEL
Cholesterol: 154 mg/dL (ref 0–200)
HDL: 47.1 mg/dL (ref >39.00)
LDL Cholesterol: 97 mg/dL (ref 0–99)
NonHDL: 106.4
Total CHOL/HDL Ratio: 3
Triglycerides: 49 mg/dL (ref 0.0–149.0)
VLDL: 9.8 mg/dL (ref 0.0–40.0)

## 2020-03-30 LAB — MICROALBUMIN / CREATININE URINE RATIO
Creatinine,U: 74.4 mg/dL
Microalb Creat Ratio: 4.2 mg/g (ref 0.0–30.0)
Microalb, Ur: 3.1 mg/dL — ABNORMAL HIGH (ref 0.0–1.9)

## 2020-03-30 LAB — COMPREHENSIVE METABOLIC PANEL
ALT: 24 U/L (ref 0–53)
AST: 15 U/L (ref 0–37)
Albumin: 4.5 g/dL (ref 3.5–5.2)
Alkaline Phosphatase: 72 U/L (ref 39–117)
BUN: 11 mg/dL (ref 6–23)
CO2: 31 mEq/L (ref 19–32)
Calcium: 9.8 mg/dL (ref 8.4–10.5)
Chloride: 97 mEq/L (ref 96–112)
Creatinine, Ser: 0.9 mg/dL (ref 0.40–1.50)
GFR: 119.18 mL/min (ref >60.00)
Glucose, Bld: 353 mg/dL — ABNORMAL HIGH (ref 70–99)
Potassium: 4.3 mEq/L (ref 3.5–5.1)
Sodium: 134 mEq/L — ABNORMAL LOW (ref 135–145)
Total Bilirubin: 0.8 mg/dL (ref 0.2–1.2)
Total Protein: 7.8 g/dL (ref 6.0–8.3)

## 2020-03-30 LAB — CBC
HCT: 46.9 % (ref 39.0–52.0)
Hemoglobin: 15.8 g/dL (ref 13.0–17.0)
MCHC: 33.7 g/dL (ref 30.0–36.0)
MCV: 88.9 fl (ref 78.0–100.0)
Platelets: 235 10*3/uL (ref 150.0–400.0)
RBC: 5.27 Mil/uL (ref 4.22–5.81)
RDW: 12.6 % (ref 11.5–15.5)
WBC: 4.7 10*3/uL (ref 4.0–10.5)

## 2020-03-30 LAB — POCT GLYCOSYLATED HEMOGLOBIN (HGB A1C): Hemoglobin A1C: 12.9 % — AB (ref 4.0–5.6)

## 2020-03-30 LAB — TSH: TSH: 1.35 u[IU]/mL (ref 0.35–4.50)

## 2020-03-30 MED ORDER — METFORMIN HCL 1000 MG PO TABS: 1000.0000 mg | ORAL_TABLET | Freq: Two times a day (BID) | ORAL | 1 refills | Status: DC

## 2020-03-30 MED ORDER — GLUCOSE BLOOD VI STRP: ORAL_STRIP | 5 refills | Status: DC

## 2020-03-30 MED ORDER — PEN NEEDLES 31G X 6 MM MISC: 2 refills | Status: DC

## 2020-03-30 MED ORDER — LANTUS SOLOSTAR 100 UNIT/ML ~~LOC~~ SOPN: 15.0000 [IU] | PEN_INJECTOR | Freq: Every day | SUBCUTANEOUS | 2 refills | Status: DC

## 2020-03-30 NOTE — Patient Instructions (Signed)
Start checking your blood sugar levels.  Appropriate times to check your blood sugar levels are:  -Before any meal (breakfast, lunch, dinner) -Two hours after any meal (breakfast, lunch, dinner) -Bedtime  Record your readings and if blood sugars are still above 200 after 2 weeks, then increase Lantus to 20 units.  Start Lantus insulin once daily. Inject 15 units to start. Increase to 20 units after 2 weeks as noted above.  Stop by the lab prior to leaving today. I will notify you of your results once received.   Please schedule a follow up appointment in 1 month for diabetes check, bring your insulin logs.  It was a pleasure to meet you today! Please don't hesitate to call or message me with any questions. Welcome to [facility]!   Diabetes Mellitus and Nutrition, Adult When you have diabetes (diabetes mellitus), it is very important to have healthy eating habits because your blood sugar (glucose) levels are greatly affected by what you eat and drink. Eating healthy foods in the appropriate amounts, at about the same times every day, can help you:  Control your blood glucose.  Lower your risk of heart disease.  Improve your blood pressure.  Reach or maintain a healthy weight. Every person with diabetes is different, and each person has different needs for a meal plan. Your health care provider may recommend that you work with a diet and nutrition specialist (dietitian) to make a meal plan that is best for you. Your meal plan may vary depending on factors such as:  The calories you need.  The medicines you take.  Your weight.  Your blood glucose, blood pressure, and cholesterol levels.  Your activity level.  Other health conditions you have, such as heart or kidney disease. How do carbohydrates affect me? Carbohydrates, also called carbs, affect your blood glucose level more than any other type of food. Eating carbs naturally raises the amount of glucose in your blood. Carb  counting is a method for keeping track of how many carbs you eat. Counting carbs is important to keep your blood glucose at a healthy level, especially if you use insulin or take certain oral diabetes medicines. It is important to know how many carbs you can safely have in each meal. This is different for every person. Your dietitian can help you calculate how many carbs you should have at each meal and for each snack. Foods that contain carbs include:  Bread, cereal, rice, pasta, and crackers.  Potatoes and corn.  Peas, beans, and lentils.  Milk and yogurt.  Fruit and juice.  Desserts, such as cakes, cookies, ice cream, and candy. How does alcohol affect me? Alcohol can cause a sudden decrease in blood glucose (hypoglycemia), especially if you use insulin or take certain oral diabetes medicines. Hypoglycemia can be a life-threatening condition. Symptoms of hypoglycemia (sleepiness, dizziness, and confusion) are similar to symptoms of having too much alcohol. If your health care provider says that alcohol is safe for you, follow these guidelines:  Limit alcohol intake to no more than 1 drink per day for nonpregnant women and 2 drinks per day for men. One drink equals 12 oz of beer, 5 oz of wine, or 1 oz of hard liquor.  Do not drink on an empty stomach.  Keep yourself hydrated with water, diet soda, or unsweetened iced tea.  Keep in mind that regular soda, juice, and other mixers may contain a lot of sugar and must be counted as carbs. What are tips for  following this plan?  Reading food labels  Start by checking the serving size on the "Nutrition Facts" label of packaged foods and drinks. The amount of calories, carbs, fats, and other nutrients listed on the label is based on one serving of the item. Many items contain more than one serving per package.  Check the total grams (g) of carbs in one serving. You can calculate the number of servings of carbs in one serving by dividing  the total carbs by 15. For example, if a food has 30 g of total carbs, it would be equal to 2 servings of carbs.  Check the number of grams (g) of saturated and trans fats in one serving. Choose foods that have low or no amount of these fats.  Check the number of milligrams (mg) of salt (sodium) in one serving. Most people should limit total sodium intake to less than 2,300 mg per day.  Always check the nutrition information of foods labeled as "low-fat" or "nonfat". These foods may be higher in added sugar or refined carbs and should be avoided.  Talk to your dietitian to identify your daily goals for nutrients listed on the label. Shopping  Avoid buying canned, premade, or processed foods. These foods tend to be high in fat, sodium, and added sugar.  Shop around the outside edge of the grocery store. This includes fresh fruits and vegetables, bulk grains, fresh meats, and fresh dairy. Cooking  Use low-heat cooking methods, such as baking, instead of high-heat cooking methods like deep frying.  Cook using healthy oils, such as olive, canola, or sunflower oil.  Avoid cooking with butter, cream, or high-fat meats. Meal planning  Eat meals and snacks regularly, preferably at the same times every day. Avoid going long periods of time without eating.  Eat foods high in fiber, such as fresh fruits, vegetables, beans, and whole grains. Talk to your dietitian about how many servings of carbs you can eat at each meal.  Eat 4-6 ounces (oz) of lean protein each day, such as lean meat, chicken, fish, eggs, or tofu. One oz of lean protein is equal to: ? 1 oz of meat, chicken, or fish. ? 1 egg. ?  cup of tofu.  Eat some foods each day that contain healthy fats, such as avocado, nuts, seeds, and fish. Lifestyle  Check your blood glucose regularly.  Exercise regularly as told by your health care provider. This may include: ? 150 minutes of moderate-intensity or vigorous-intensity exercise  each week. This could be brisk walking, biking, or water aerobics. ? Stretching and doing strength exercises, such as yoga or weightlifting, at least 2 times a week.  Take medicines as told by your health care provider.  Do not use any products that contain nicotine or tobacco, such as cigarettes and e-cigarettes. If you need help quitting, ask your health care provider.  Work with a Social worker or diabetes educator to identify strategies to manage stress and any emotional and social challenges. Questions to ask a health care provider  Do I need to meet with a diabetes educator?  Do I need to meet with a dietitian?  What number can I call if I have questions?  When are the best times to check my blood glucose? Where to find more information:  American Diabetes Association: diabetes.org  Academy of Nutrition and Dietetics: www.eatright.[employer] of Diabetes and Digestive and Kidney Diseases (NIH): [employer] Summary  A healthy meal plan will help you control your blood glucose  and maintain a healthy lifestyle.  Working with a diet and nutrition specialist (dietitian) can help you make a meal plan that is best for you.  Keep in mind that carbohydrates (carbs) and alcohol have immediate effects on your blood glucose levels. It is important to count carbs and to use alcohol carefully. This information is not intended to replace advice given to you by your health care provider. Make sure you discuss any questions you have with your health care provider. Document Revised: 11/13/2017 Document Reviewed: 01/05/2017 Elsevier Patient Education  2020 [employer].

## 2020-03-30 NOTE — Assessment & Plan Note (Signed)
Chronic for years, A1C today of 12.9 on metformin 500 mg BID.   It seems that he has some pancreatic function, but he may be headed towards Type 1 diabetes.   Given A1C of 12.9 we will increase metformin to 1000 mg BID, add Lantus 15 units to start. He will increase Lantus to 20 units after two weeks if glucose readings are >200.   Discussed to start checking blood sugars regularly, log sheets provided.   We will plan to see him back in 1 month with his log sheets.

## 2020-03-30 NOTE — Progress Notes (Signed)
Subjective:    Patient ID: Danny Crawford, male    DOB: Feb 02, 1991, 29 y.o.   MRN: [id number]  HPI  This visit occurred during the SARS-CoV-2 public health emergency.  Safety protocols were in place, including screening questions prior to the visit, additional usage of staff PPE, and extensive cleaning of exam room while observing appropriate contact time as indicated for disinfecting solutions.   Danny Crawford is a 29 year old male who presents today to establish care and discuss the problems mentioned below. Will obtain/review records. He has not seen   1) Type 2 Diabetes: Diagnosed several years ago after symptoms of polyuria, fatigue, blurred vision. Previously following with endocrinology, Dr. Lucianne Muss, with last visit being in April 2018.   He has been managed on Metformin 500 and 1000 mg BID, Lantus 40 units in the past. A1C around his last visit in 2018 of 13 with fructosamine of 488. The plan was to start Victoza, but he never received the medications.   He is currently managed on Metformin 500 mg twice daily consistently until last week when he ran out. He denies polyuria, polydipsia, polyphagia.   He exercises four days weekly at the gym. He endorses a poor diet lately, fast food and sweets. He is not checking his blood sugars regularly, last check was three weeks ago which was 255 at 10 am before breakfast.   He has a family history of diabetes on his father's side. The patient was diagnosed at the age of 63 with "type 1 diabetes" and then moved to [facility] in 2017 and was told that he has type 2 diabetes.   BP Readings from Last 3 Encounters:  03/30/20 130/86  05/26/19 (!) 132/92  05/04/19 129/84     Review of Systems  Eyes: Negative for visual disturbance.  Respiratory: Negative for shortness of breath.   Cardiovascular: Negative for chest pain.  Endocrine: Negative for polydipsia, polyphagia and polyuria.  Neurological: Negative for dizziness.       Past Medical History:    Diagnosis Date  . Diabetes mellitus without complication (HCC)   . Hypertension      Social History   Socioeconomic History  . Marital status: Married    Spouse name: Not on file  . Number of children: Not on file  . Years of education: Not on file  . Highest education level: Not on file  Occupational History  . Not on file  Tobacco Use  . Smoking status: Former Games developer  . Smokeless tobacco: Never Used  Substance and Sexual Activity  . Alcohol use: Yes    Comment: socially  . Drug use: No  . Sexual activity: Not on file  Other Topics Concern  . Not on file  Social History Narrative  . Not on file   Social Determinants of Health   Financial Resource Strain:   . Difficulty of Paying Living Expenses:   Food Insecurity:   . Worried About Programme researcher, broadcasting/film/video in the Last Year:   . Barista in the Last Year:   Transportation Needs:   . Freight forwarder (Medical):   Marland Kitchen Lack of Transportation (Non-Medical):   Physical Activity:   . Days of Exercise per Week:   . Minutes of Exercise per Session:   Stress:   . Feeling of Stress :   Social Connections:   . Frequency of Communication with Friends and Family:   . Frequency of Social Gatherings with Friends and Family:   .  Attends Religious Services:   . Active Member of Clubs or Organizations:   . Attends Archivist Meetings:   Marland Kitchen Marital Status:   Intimate Partner Violence:   . Fear of Current or Ex-Partner:   . Emotionally Abused:   Marland Kitchen Physically Abused:   . Sexually Abused:     No past surgical history on file.  Family History  Problem Relation Age of Onset  . Diabetes Father   . Hypertension Father     Allergies  Allergen Reactions  . Shellfish Allergy Anaphylaxis and Swelling    Face and throat swell    No current outpatient medications on file prior to visit.   No current facility-administered medications on file prior to visit.    BP 130/86   Pulse 84   Temp (!) 96.9 F (36.1  C) (Temporal)   Ht 6\' 1"  (1.854 m)   Wt 230 lb 4 oz (104.4 kg)   SpO2 98%   BMI 30.38 kg/m    Objective:   Physical Exam  Constitutional: He appears well-nourished.  Cardiovascular: Normal rate and regular rhythm.  Respiratory: Effort normal and breath sounds normal.  Musculoskeletal:     Cervical back: Neck supple.  Skin: Skin is warm and dry.  Psychiatric: He has a normal mood and affect.           Assessment & Plan:

## 2020-05-10 ENCOUNTER — Ambulatory Visit: Payer: Managed Care, Other (non HMO) | Admitting: Primary Care

## 2020-11-23 ENCOUNTER — Emergency Department (HOSPITAL_COMMUNITY)
Admission: EM | Admit: 2020-11-23 | Discharge: 2020-11-23 | Discharge status: Absent without leave | Payer: Managed Care, Other (non HMO)

## 2021-05-09 IMAGING — CT CT HEAD WITHOUT CONTRAST
4 series · 16 of 47 positions shown, 18 images · non-contrast
Comparison: None.

CLINICAL DATA: Initial evaluation for numbness, tingling,
paresthesias.

EXAM:
CT HEAD WITHOUT CONTRAST
TECHNIQUE: Contiguous axial images were obtained from the base of the skull
through the vertex without intravenous contrast.

[Series 3: head without · axial · non-contrast · 0.50mm/px · z∈[-86,+49]mm · 7 of 37 slices shown, 9 images]
[im 5/37  brain]
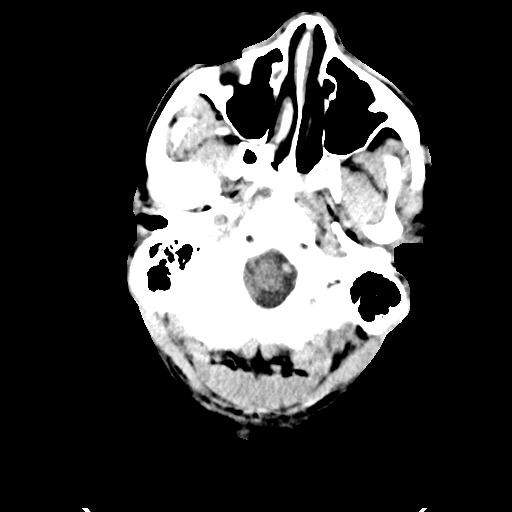
[im 5/37  bone]
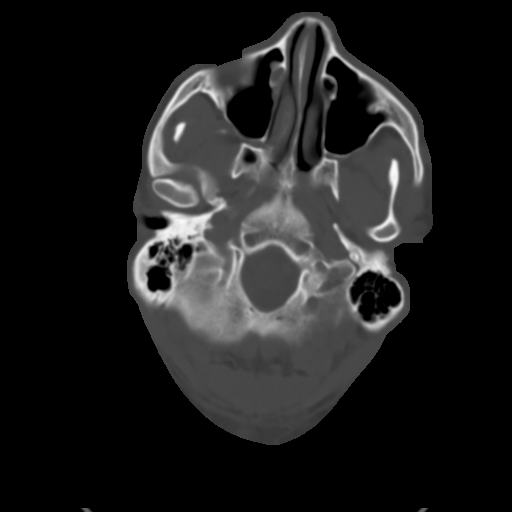
[im 10/37  brain]
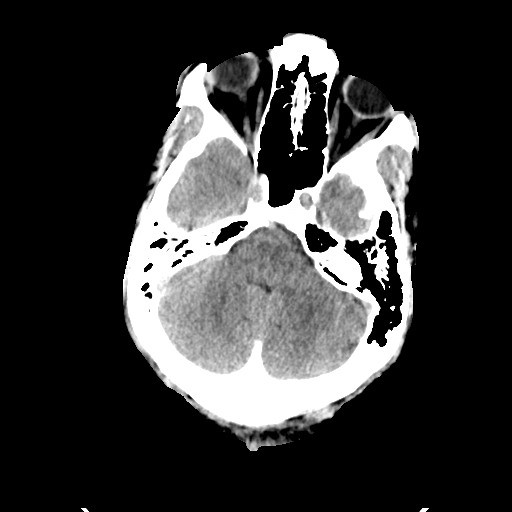
[im 14/37  brain]
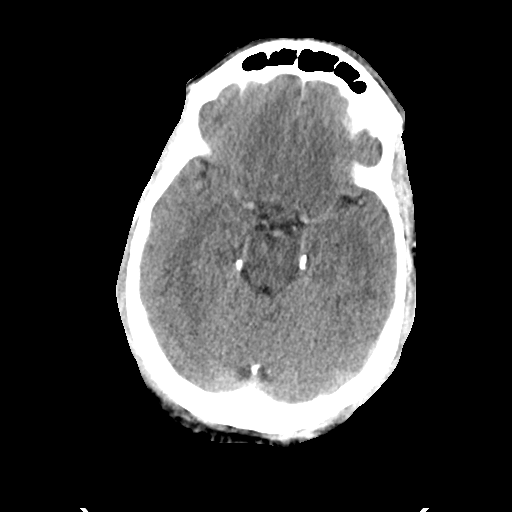
[im 19/37  brain]
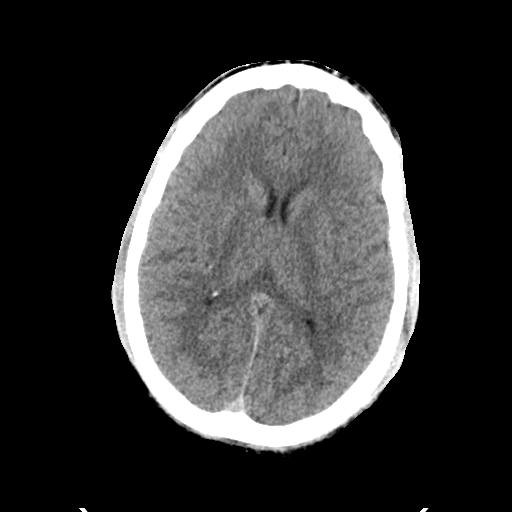
[im 23/37  brain]
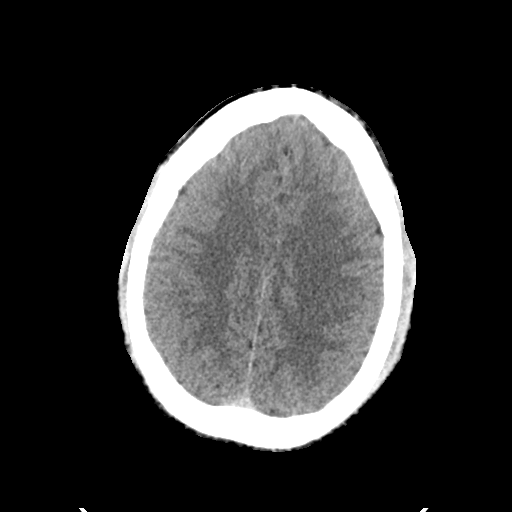
[im 23/37  bone]
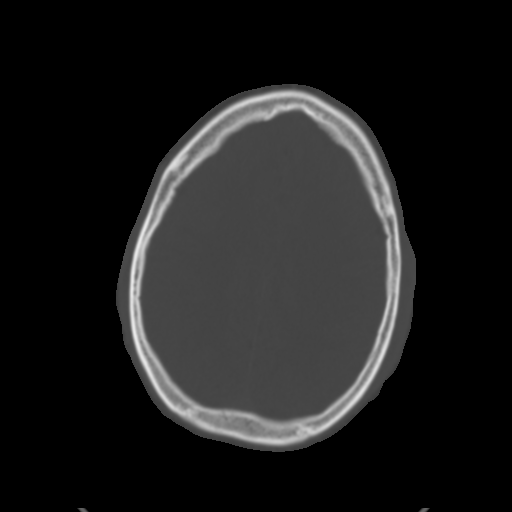
[im 28/37  brain]
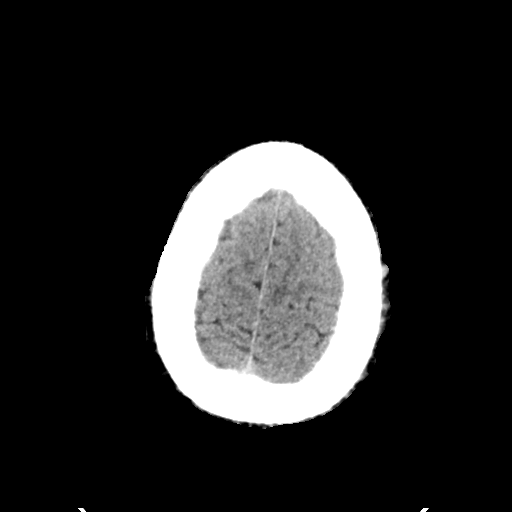
[im 32/37  brain]
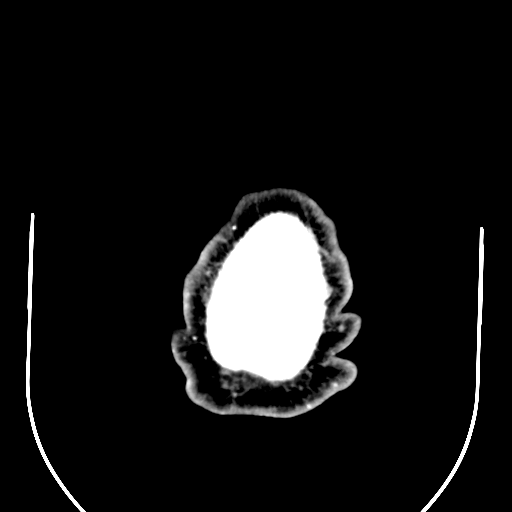

[Series 4: head bone · axial · 0.50mm/px · z∈[-88,-52]mm · 3 of 93 slices shown]
[im 10/93  bone]
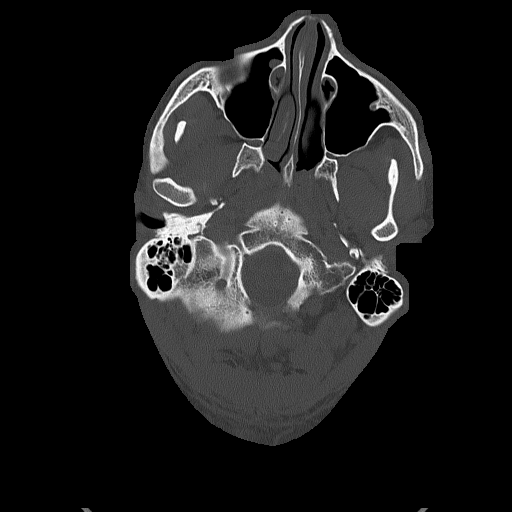
[im 19/93  bone]
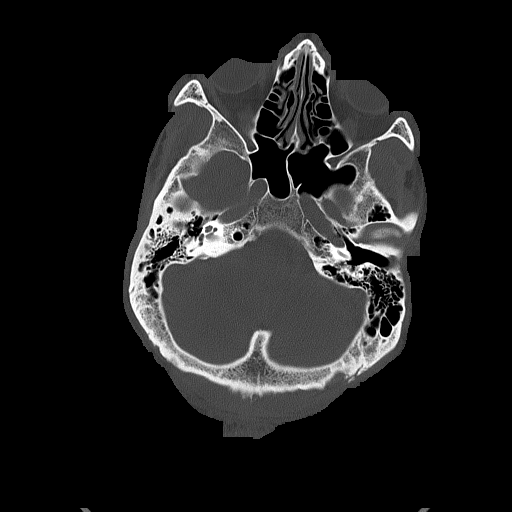
[im 28/93  bone]
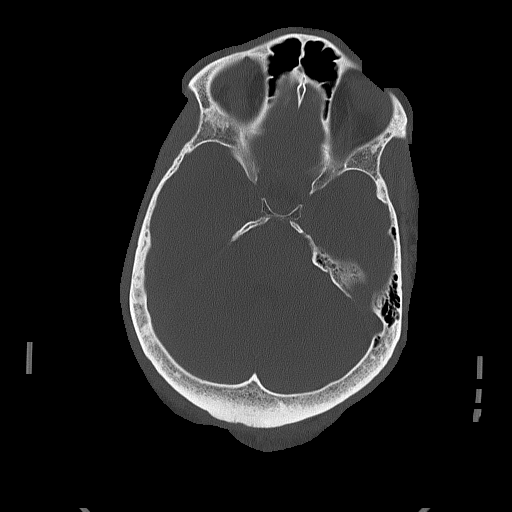

[Series 5: head without cor · coronal · non-contrast · 0.35mm/px · 3 of 77 slices shown]
[im 26/77  brain]
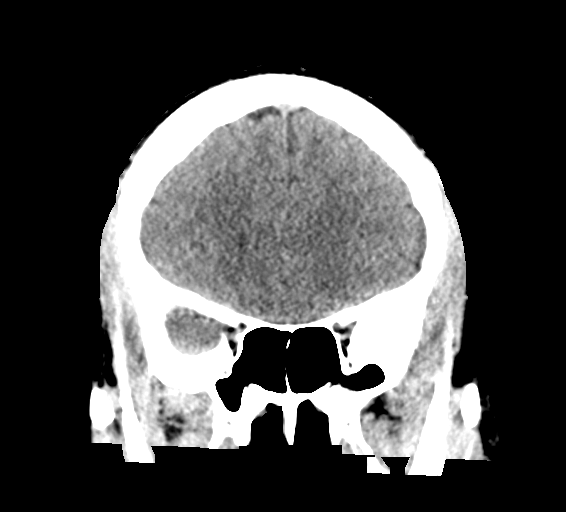
[im 34/77  brain]
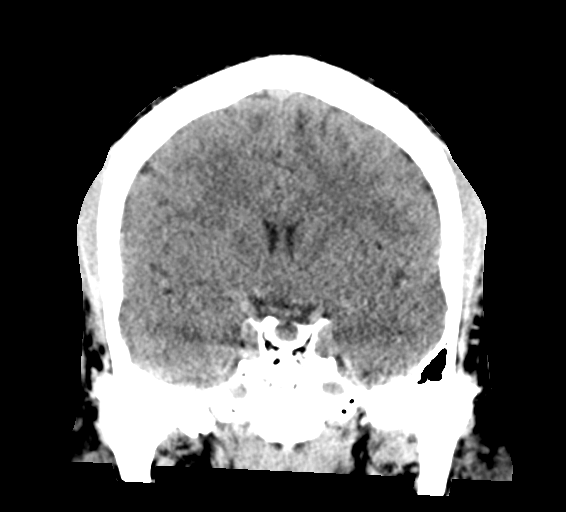
[im 43/77  brain]
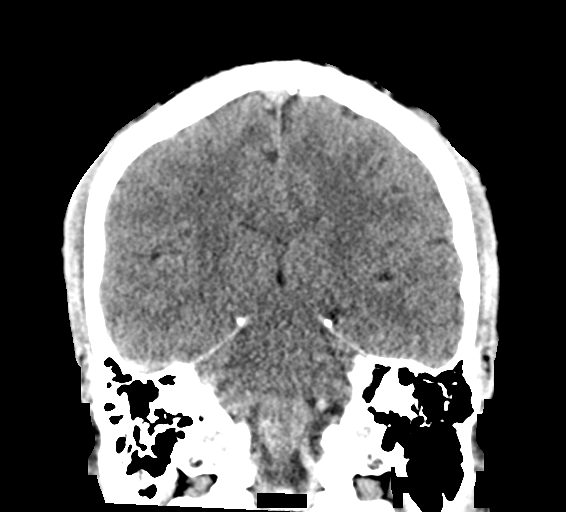

[Series 6: head without sag · sagittal · non-contrast · 0.39mm/px · 3 of 67 slices shown]
[im 23/67  brain]
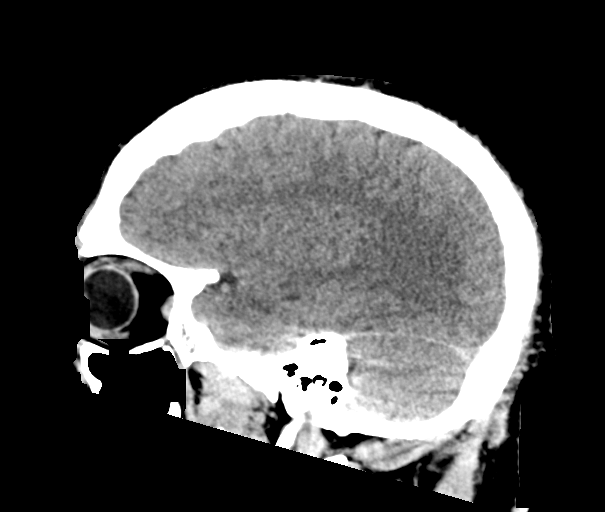
[im 34/67  brain]
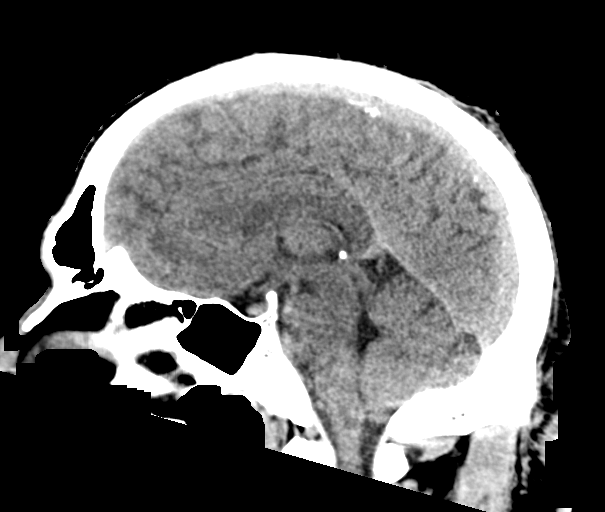
[im 45/67  brain]
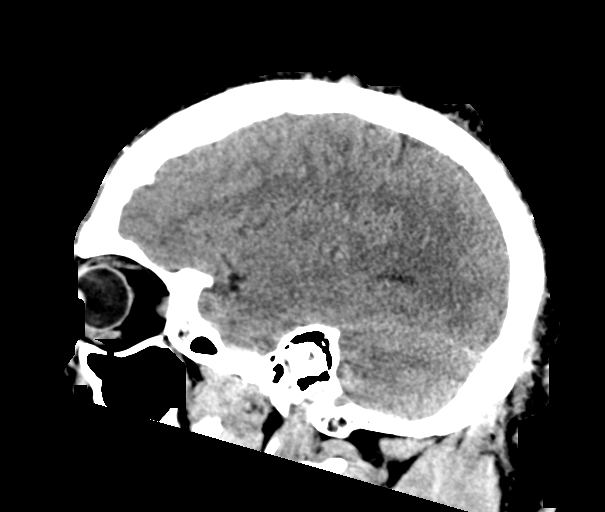

[16 of 47 positions shown; findings below may reference images not displayed]

FINDINGS: Brain: Cerebral volume within normal limits for patient age.

No evidence for acute intracranial hemorrhage. No findings to
suggest acute large vessel territory infarct. No mass lesion,
midline shift, or mass effect. Ventricles are normal in size without
evidence for hydrocephalus. No extra-axial fluid collection
identified.

Vascular: No hyperdense vessel identified.

Skull: Scalp soft tissues demonstrate no acute abnormality. Probable
keloid formation noted at the posterior neck. Calvarium intact.

Sinuses/Orbits: Globes and orbital soft tissues within normal
limits.

Visualized paranasal sinuses are clear. No mastoid effusion.
IMPRESSION: Normal head CT.  No acute intracranial abnormality identified.

## 2021-05-15 ENCOUNTER — Ambulatory Visit: Payer: Managed Care, Other (non HMO) | Admitting: Dietician

## 2024-12-10 ENCOUNTER — Encounter (HOSPITAL_BASED_OUTPATIENT_CLINIC_OR_DEPARTMENT_OTHER): Payer: Self-pay | Admitting: *Deleted

## 2024-12-10 ENCOUNTER — Other Ambulatory Visit: Payer: Self-pay

## 2024-12-10 ENCOUNTER — Inpatient Hospital Stay (HOSPITAL_BASED_OUTPATIENT_CLINIC_OR_DEPARTMENT_OTHER)
Admission: EM | Admit: 2024-12-10 | Discharge: 2024-12-13 | DRG: 256 | Disposition: A | Discharge status: Home / Self Care | Payer: Self-pay | Attending: Internal Medicine | Admitting: Internal Medicine

## 2024-12-10 ENCOUNTER — Emergency Department (HOSPITAL_BASED_OUTPATIENT_CLINIC_OR_DEPARTMENT_OTHER): Payer: Self-pay

## 2024-12-10 DIAGNOSIS — E1165 Type 2 diabetes mellitus with hyperglycemia: Secondary | ICD-10-CM | POA: Diagnosis present

## 2024-12-10 DIAGNOSIS — E11621 Type 2 diabetes mellitus with foot ulcer: Secondary | ICD-10-CM | POA: Diagnosis present

## 2024-12-10 DIAGNOSIS — L089 Local infection of the skin and subcutaneous tissue, unspecified: Secondary | ICD-10-CM | POA: Diagnosis present

## 2024-12-10 DIAGNOSIS — Z794 Long term (current) use of insulin: Secondary | ICD-10-CM

## 2024-12-10 DIAGNOSIS — Z833 Family history of diabetes mellitus: Secondary | ICD-10-CM

## 2024-12-10 DIAGNOSIS — L97529 Non-pressure chronic ulcer of other part of left foot with unspecified severity: Secondary | ICD-10-CM | POA: Diagnosis present

## 2024-12-10 DIAGNOSIS — Z8249 Family history of ischemic heart disease and other diseases of the circulatory system: Secondary | ICD-10-CM

## 2024-12-10 DIAGNOSIS — Z79899 Other long term (current) drug therapy: Secondary | ICD-10-CM

## 2024-12-10 DIAGNOSIS — M869 Osteomyelitis, unspecified: Secondary | ICD-10-CM | POA: Diagnosis present

## 2024-12-10 DIAGNOSIS — E1152 Type 2 diabetes mellitus with diabetic peripheral angiopathy with gangrene: Principal | ICD-10-CM | POA: Diagnosis present

## 2024-12-10 DIAGNOSIS — R000 Tachycardia, unspecified: Secondary | ICD-10-CM | POA: Diagnosis present

## 2024-12-10 DIAGNOSIS — Z6830 Body mass index (BMI) 30.0-30.9, adult: Secondary | ICD-10-CM

## 2024-12-10 DIAGNOSIS — I10 Essential (primary) hypertension: Secondary | ICD-10-CM | POA: Diagnosis present

## 2024-12-10 DIAGNOSIS — M86172 Other acute osteomyelitis, left ankle and foot: Principal | ICD-10-CM | POA: Diagnosis present

## 2024-12-10 DIAGNOSIS — Z87891 Personal history of nicotine dependence: Secondary | ICD-10-CM

## 2024-12-10 DIAGNOSIS — E1169 Type 2 diabetes mellitus with other specified complication: Secondary | ICD-10-CM | POA: Diagnosis present

## 2024-12-10 DIAGNOSIS — E1140 Type 2 diabetes mellitus with diabetic neuropathy, unspecified: Secondary | ICD-10-CM | POA: Diagnosis present

## 2024-12-10 DIAGNOSIS — Z7984 Long term (current) use of oral hypoglycemic drugs: Secondary | ICD-10-CM

## 2024-12-10 DIAGNOSIS — Z91013 Allergy to seafood: Secondary | ICD-10-CM

## 2024-12-10 DIAGNOSIS — E669 Obesity, unspecified: Secondary | ICD-10-CM | POA: Diagnosis present

## 2024-12-10 DIAGNOSIS — Z91199 Patient's noncompliance with other medical treatment and regimen due to unspecified reason: Secondary | ICD-10-CM

## 2024-12-10 LAB — CBC WITH DIFFERENTIAL/PLATELET
Abs Immature Granulocytes: 0.04 K/uL (ref 0.00–0.07)
Basophils Absolute: 0 K/uL (ref 0.0–0.1)
Basophils Relative: 0 %
Eosinophils Absolute: 0.1 K/uL (ref 0.0–0.5)
Eosinophils Relative: 1 %
HCT: 36.9 % — ABNORMAL LOW (ref 39.0–52.0)
Hemoglobin: 12.6 g/dL — ABNORMAL LOW (ref 13.0–17.0)
Immature Granulocytes: 0 %
Lymphocytes Relative: 22 %
Lymphs Abs: 2 K/uL (ref 0.7–4.0)
MCH: 28.2 pg (ref 26.0–34.0)
MCHC: 34.1 g/dL (ref 30.0–36.0)
MCV: 82.6 fL (ref 80.0–100.0)
Monocytes Absolute: 0.9 K/uL (ref 0.1–1.0)
Monocytes Relative: 10 %
Neutro Abs: 6.1 K/uL (ref 1.7–7.7)
Neutrophils Relative %: 67 %
Platelets: 333 K/uL (ref 150–400)
RBC: 4.47 MIL/uL (ref 4.22–5.81)
RDW: 11.8 % (ref 11.5–15.5)
WBC: 9.2 K/uL (ref 4.0–10.5)
nRBC: 0 % (ref 0.0–0.2)

## 2024-12-10 LAB — COMPREHENSIVE METABOLIC PANEL WITH GFR
ALT: 18 U/L (ref 0–44)
AST: 17 U/L (ref 15–41)
Albumin: 3.5 g/dL (ref 3.5–5.0)
Alkaline Phosphatase: 90 U/L (ref 38–126)
Anion gap: 18 — ABNORMAL HIGH (ref 5–15)
BUN: 8 mg/dL (ref 6–20)
CO2: 23 mmol/L (ref 22–32)
Calcium: 10 mg/dL (ref 8.9–10.3)
Chloride: 92 mmol/L — ABNORMAL LOW (ref 98–111)
Creatinine, Ser: 0.74 mg/dL (ref 0.61–1.24)
GFR, Estimated: >60 mL/min
Glucose, Bld: 241 mg/dL — ABNORMAL HIGH (ref 70–99)
Potassium: 3.9 mmol/L (ref 3.5–5.1)
Sodium: 134 mmol/L — ABNORMAL LOW (ref 135–145)
Total Bilirubin: 0.6 mg/dL (ref 0.0–1.2)
Total Protein: 9.9 g/dL — ABNORMAL HIGH (ref 6.5–8.1)

## 2024-12-10 LAB — SEDIMENTATION RATE: Sed Rate: 110 mm/h — ABNORMAL HIGH (ref 0–16)

## 2024-12-10 LAB — LACTIC ACID, PLASMA: Lactic Acid, Venous: 1.7 mmol/L (ref 0.5–1.9)

## 2024-12-10 MED ORDER — VANCOMYCIN HCL IN DEXTROSE 1-5 GM/200ML-% IV SOLN
1000.0000 mg | Freq: Once | INTRAVENOUS | Status: AC
  Administered 2024-12-10: 1000 mg via INTRAVENOUS
  Filled 2024-12-10: qty 200

## 2024-12-10 MED ORDER — SODIUM CHLORIDE 0.9 % IV BOLUS
1000.0000 mL | Freq: Once | INTRAVENOUS | Status: AC
  Administered 2024-12-10: 1000 mL via INTRAVENOUS

## 2024-12-10 NOTE — Progress Notes (Signed)
 ED Pharmacy Antibiotic Sign Off An antibiotic consult was received from an ED provider for vancomycin  per pharmacy dosing for wound infection. A chart review was completed to assess appropriateness.   The following one time order(s) were placed:  Vancomycin  2g IV x 1  Further antibiotic and/or antibiotic pharmacy consults should be ordered by the admitting provider if indicated.   Thank you for allowing pharmacy to be a part of this patient's care.   Dorn Poot, [facility]  Clinical Pharmacist 12/10/2024 7:55 PM

## 2024-12-10 NOTE — ED Triage Notes (Addendum)
 Pt wears steel toe boots at work and he has pain in left great toe.  Pt has wound which is wrapped and it began draining yesterday.  Malodorous. No fever or chills.

## 2024-12-10 NOTE — ED Provider Notes (Addendum)
 " [place] EMERGENCY DEPARTMENT AT [facility] Provider Note   CSN: [id number] Arrival date & time: 12/10/24  8169     Patient presents with: Toe Injury and Wound Check   Danny Crawford is a 33 y.o. male.   This is a 33 year old male here today with pain in his left great toe.  Patient reports that he wears steel toe boots at work, has been having some pain in the area for the last few days.  He is diabetic, does not feel his toes very well.  He states that he first got it 3 weeks ago.  He noticed an odor and drainage over the last few days.   Wound Check       Prior to Admission medications  Medication Sig Start Date End Date Taking? Authorizing Provider  glucose blood test strip Use as instructed 03/30/20   Clark, Katherine K, NP  insulin  glargine (LANTUS  SOLOSTAR) 100 UNIT/ML Solostar Pen Inject 15 Units into the skin daily. 03/30/20   Clark, Katherine K, NP  Insulin  Pen Needle (PEN NEEDLES) 31G X 6 MM MISC Use daily with insulin  pen 03/30/20   Clark, Katherine K, NP  metFORMIN  (GLUCOPHAGE ) 1000 MG tablet Take 1 tablet (1,000 mg total) by mouth 2 (two) times daily with a meal. For diabetes. 03/30/20   Gretta Comer POUR, NP    Allergies: Shellfish allergy    Review of Systems  Updated Vital Signs BP 129/85   Pulse (!) 133   Temp 97.8 F (36.6 C)   Resp 18   SpO2 92%   Physical Exam Vitals and nursing note reviewed.  Constitutional:      Appearance: He is not ill-appearing.  Eyes:     Pupils: Pupils are equal, round, and reactive to light.  Cardiovascular:     Rate and Rhythm: Tachycardia present.  Musculoskeletal:        General: Swelling present. Normal range of motion.     Comments: Malodorous, swollen ulcerated great toe.  There is no crepitus.       (all labs ordered are listed, but only abnormal results are displayed) Labs Reviewed  COMPREHENSIVE METABOLIC PANEL WITH GFR - Abnormal; Notable for the following components:      Result Value    Sodium 134 (*)    Chloride 92 (*)    Glucose, Bld 241 (*)    Total Protein 9.9 (*)    Anion gap 18 (*)    All other components within normal limits  CBC WITH DIFFERENTIAL/PLATELET - Abnormal; Notable for the following components:   Hemoglobin 12.6 (*)    HCT 36.9 (*)    All other components within normal limits  LACTIC ACID, PLASMA  LACTIC ACID, PLASMA  C-REACTIVE PROTEIN  SEDIMENTATION RATE    EKG: None  Radiology: No results found.   Procedures   Medications Ordered in the ED  sodium chloride  0.9 % bolus 1,000 mL (has no administration in time range)  vancomycin  (VANCOCIN ) IVPB 1000 mg/200 mL premix (has no administration in time range)    Followed by  vancomycin  (VANCOCIN ) IVPB 1000 mg/200 mL premix (has no administration in time range)                                    Medical Decision Making 33 year old male here today for a toe infection.  Differential diagnoses include osteomyelitis, cellulitis, less likely necrotizing soft tissue infection.  Plan-high concern for osteomyelitis in the patient's toe.  He is diabetic.  Will provide him with IV fluids and vancomycin .  Will reach out to podiatry as they believe the patient has acute osteomyelitis which will require amputation.  Spoke with Dr. Janit from podiatry who agreed to see the patient tomorrow.  Will admit to hospitalist.  Plain films show some gas in the distal great toe which is unsurprising given the ulcerations.  I do not believe this represents a necrotizing soft tissue infection at this time, particularly given the patient's absence of fever, no leukocytosis.  Amount and/or Complexity of Data Reviewed Labs: ordered. Radiology: ordered.  Risk Prescription drug management. Decision regarding hospitalization.        Final diagnoses:  Other acute osteomyelitis of left foot [facility])    ED Discharge Orders     None          Mannie Fairy DASEN, DO 12/10/24 2010    Mannie Fairy T,  DO 12/10/24 2110  "

## 2024-12-11 ENCOUNTER — Inpatient Hospital Stay (HOSPITAL_COMMUNITY): Payer: Self-pay

## 2024-12-11 LAB — CBC
HCT: 37 % — ABNORMAL LOW (ref 39.0–52.0)
Hemoglobin: 12.9 g/dL — ABNORMAL LOW (ref 13.0–17.0)
MCH: 28.8 pg (ref 26.0–34.0)
MCHC: 34.9 g/dL (ref 30.0–36.0)
MCV: 82.6 fL (ref 80.0–100.0)
Platelets: 315 K/uL (ref 150–400)
RBC: 4.48 MIL/uL (ref 4.22–5.81)
RDW: 11.8 % (ref 11.5–15.5)
WBC: 7.4 K/uL (ref 4.0–10.5)
nRBC: 0 % (ref 0.0–0.2)

## 2024-12-11 LAB — CREATININE, SERUM
Creatinine, Ser: 0.69 mg/dL (ref 0.61–1.24)
GFR, Estimated: >60 mL/min

## 2024-12-11 LAB — HIV ANTIBODY (ROUTINE TESTING W REFLEX): HIV Screen 4th Generation wRfx: NONREACTIVE

## 2024-12-11 LAB — GLUCOSE, CAPILLARY
Glucose-Capillary: 293 mg/dL — ABNORMAL HIGH (ref 70–99)
Glucose-Capillary: 274 mg/dL — ABNORMAL HIGH (ref 70–99)

## 2024-12-11 LAB — C-REACTIVE PROTEIN: CRP: 8.8 mg/dL — ABNORMAL HIGH

## 2024-12-11 LAB — HEMOGLOBIN A1C
Hgb A1c MFr Bld: 12.5 % — ABNORMAL HIGH (ref 4.8–5.6)
Mean Plasma Glucose: 312.05 mg/dL

## 2024-12-11 MED ORDER — ACETAMINOPHEN 650 MG RE SUPP: 650.0000 mg | Freq: Four times a day (QID) | RECTAL | Status: DC | PRN

## 2024-12-11 MED ORDER — MUPIROCIN 2 % EX OINT
1.0000 | TOPICAL_OINTMENT | Freq: Two times a day (BID) | CUTANEOUS | Status: DC
  Administered 2024-12-11 – 2024-12-13 (×3): 1 via NASAL
  Filled 2024-12-11: qty 22

## 2024-12-11 MED ORDER — INSULIN ASPART 100 UNIT/ML IJ SOLN
0.0000 [IU] | Freq: Three times a day (TID) | INTRAMUSCULAR | Status: DC
  Administered 2024-12-11: 5 [IU] via SUBCUTANEOUS
  Administered 2024-12-12: 7 [IU] via SUBCUTANEOUS
  Administered 2024-12-12: 3 [IU] via SUBCUTANEOUS
  Administered 2024-12-12: 7 [IU] via SUBCUTANEOUS
  Filled 2024-12-11: qty 5

## 2024-12-11 MED ORDER — METRONIDAZOLE 500 MG/100ML IV SOLN
500.0000 mg | Freq: Two times a day (BID) | INTRAVENOUS | Status: DC
  Administered 2024-12-11 – 2024-12-13 (×5): 500 mg via INTRAVENOUS
  Filled 2024-12-11 (×2): qty 100

## 2024-12-11 MED ORDER — HYDROCODONE-ACETAMINOPHEN 5-325 MG PO TABS: 1.0000 | ORAL_TABLET | ORAL | Status: DC | PRN

## 2024-12-11 MED ORDER — ENOXAPARIN SODIUM 40 MG/0.4ML IJ SOSY
40.0000 mg | PREFILLED_SYRINGE | INTRAMUSCULAR | Status: DC
  Administered 2024-12-11 – 2024-12-12 (×2): 40 mg via SUBCUTANEOUS
  Filled 2024-12-11: qty 0.4

## 2024-12-11 MED ORDER — VANCOMYCIN HCL IN DEXTROSE 1-5 GM/200ML-% IV SOLN
1000.0000 mg | Freq: Three times a day (TID) | INTRAVENOUS | Status: DC
  Administered 2024-12-11 – 2024-12-13 (×6): 1000 mg via INTRAVENOUS
  Filled 2024-12-11 (×2): qty 200

## 2024-12-11 MED ORDER — ACETAMINOPHEN 325 MG PO TABS
650.0000 mg | ORAL_TABLET | Freq: Four times a day (QID) | ORAL | Status: DC | PRN
  Administered 2024-12-12: 650 mg via ORAL

## 2024-12-11 MED ORDER — SODIUM CHLORIDE 0.9 % IV SOLN
2.0000 g | Freq: Three times a day (TID) | INTRAVENOUS | Status: DC
  Administered 2024-12-11 – 2024-12-13 (×6): 2 g via INTRAVENOUS
  Filled 2024-12-11 (×2): qty 12.5

## 2024-12-11 MED ORDER — SENNOSIDES-DOCUSATE SODIUM 8.6-50 MG PO TABS: 1.0000 | ORAL_TABLET | Freq: Every evening | ORAL | Status: DC | PRN

## 2024-12-11 NOTE — Anesthesia Preprocedure Evaluation (Signed)
"                                    Anesthesia Evaluation  Patient identified by MRN, date of birth, ID band Patient awake    Reviewed: Allergy & Precautions, NPO status , Patient's Chart, lab work & pertinent test results  History of Anesthesia Complications Negative for: history of anesthetic complications  Airway Mallampati: II  TM Distance: >3 FB Neck ROM: Full    Dental  (+) Dental Advisory Given   Pulmonary former smoker   Pulmonary exam normal        Cardiovascular hypertension,  Rhythm:Regular Rate:Tachycardia     Neuro/Psych negative neurological ROS  negative psych ROS   GI/Hepatic negative GI ROS, Neg liver ROS,,,  Endo/Other  diabetes, Poorly Controlled, Type 2   Obesity   Renal/GU negative Renal ROS     Musculoskeletal negative musculoskeletal ROS (+)    Abdominal   Peds  Hematology negative hematology ROS (+)   Anesthesia Other Findings Noncompliant   Reproductive/Obstetrics                              Anesthesia Physical Anesthesia Plan  ASA: 3  Anesthesia Plan: MAC   Post-op Pain Management: Tylenol  PO (pre-op)*   Induction:   PONV Risk Score and Plan: 1 and Propofol  infusion and Treatment may vary due to age or medical condition  Airway Management Planned: Natural Airway and Simple Face Mask  Additional Equipment: None  Intra-op Plan:   Post-operative Plan:   Informed Consent: I have reviewed the patients History and Physical, chart, labs and discussed the procedure including the risks, benefits and alternatives for the proposed anesthesia with the patient or authorized representative who has indicated his/her understanding and acceptance.       Plan Discussed with: CRNA and Anesthesiologist  Anesthesia Plan Comments:          Anesthesia Quick Evaluation  "

## 2024-12-11 NOTE — Consult Note (Signed)
 WOC Nurse Consult Note: Reason for Consult: wound in a new patient  Wound type: full thickness L great toe r/t trauma and neuropathy  Pressure Injury POA: NA not pressure  Measurement: see nursing flowsheet  Wound bed: red moist on lateral edge of 5th digit; dark hemorrhagic tissue noted  Drainage (amount, consistency, odor) purulent and foul smelling per MD notes  Periwound: edema and erythema  Dressing procedure/placement/frequency: Cleanse L great toe with Betadine, wrap in Xeroform guaze Soila #[id number]) and wrap with dry gauze/Kerlix roll gauze and tape.   This patient is being followed by podiatry with plans for likely amputation 12/29.  Any wound care orders placed by podiatry supercede those placed by [facility] team.   WOC team will not follow. REconsult if further needs arise.   Thank you    Powell Bar MSN, RN-BC, CWOCN

## 2024-12-11 NOTE — Assessment & Plan Note (Addendum)
 Med history pending but patient's home regimen appears to be glargine 15 units daily and metformin  1000 mg twice daily. --Sliding scale NovoLog  -- Basal coverage 10 units daily for now --Titrate regimen as needed for goal 140-180 -- Check hemoglobin A1c

## 2024-12-11 NOTE — Progress Notes (Signed)
 Pharmacy Antibiotic Note  Danny Crawford is a 33 y.o. male admitted on 12/10/2024 with toe infection and concern for osteo.  Pharmacy has been consulted for vancomycin  dosing.  Vancomycin  2g IV x 1 given in ED  Plan: Vancomycin  1g IV q 8h (eAUC 460) Monitor renal function, Cx and source control plans with podiatry Vancomycin  levels as indicated  Height: 6' 1 (185.4 cm) Weight: 104 kg (229 lb 4.5 oz) IBW/kg (Calculated) : 79.9  Temp (24hrs), Avg:98.3 F (36.8 C), Min:97.8 F (36.6 C), Max:98.5 F (36.9 C)  Recent Labs  Lab 12/10/24 1915  WBC 9.2  CREATININE 0.74  LATICACIDVEN 1.7    Estimated Creatinine Clearance: 163.2 mL/min (by C-G formula based on SCr of 0.74 mg/dL).    Allergies[1]  Dorn Poot, PharmD, [facility] Clinical Pharmacist ED Pharmacist Phone # [phone] 12/11/2024 10:07 AM      [1]  Allergies Allergen Reactions   Shellfish Allergy Anaphylaxis and Swelling    Face and throat swell

## 2024-12-11 NOTE — Progress Notes (Signed)
 Received consult from [facility], Dr. Fairy Gravely, re: osteo great toe. Being transferred to [facility], waiting bed placement.   Will consult on patient once transferred and likely set up amputation surgery for Monday, 12/29 w/ Dr. Malvin, [facility].   Okay to eat. NPO after midnight.  Thresa EMERSON Sar

## 2024-12-11 NOTE — Plan of Care (Signed)

## 2024-12-11 NOTE — ED Notes (Signed)
 Called Nataya at [facility] for transport 13:44-TC

## 2024-12-11 NOTE — H&P (Addendum)
 "  Telemedicine History and Physical    Patient: Danny Crawford FMW:[id number] DOB: [birth year] DOA: 12/10/2024 DOS: the patient was seen and examined on 12/11/2024 PCP: Gretta Comer POUR, NP   Referring Provider: Dr. Fairy Gravely, DO Telemedicine Provider: Dr. Burnard Cunning, DO Patient Location: [facility] Referring Diagnosis: Left great toe osteomyelitis Patient Name and DOB verified: Danny Crawford, Mar 29, 1991 Patient consented to Telemedicine Evaluation: Yes RN virtual assistant: Toribio Jobs, RN Video encounter time and date: 12/11/2024 8:20 AM   Patient coming from: Home  Chief Complaint:  Chief Complaint  Patient presents with   Toe Injury   Wound Check   HPI: Danny Crawford is a 33 y.o. male with medical history significant of type 2 diabetes with neuropathy presented to [facility] ED for evaluation of a worsening ulcer of the left great toe worsening for the past few days.  He reports wearing steel toed boots at work, and due to neuropathy lacks some sensation to his toes.  He reports having to prior similar small scarred areas on his foot from work boots that healed well, but this time became a worsening ulcer.  Initially noticed wound several weeks ago, but recently worsening pain and developed malodorous drainage.  He denies fever/chills or feeling run down.  He denies other recent illnesses including cough, sore throat, congestion, abdominal pain nausea vomiting diarrhea.  He does report a isolated episode of vomiting on Wednesday that resolved.  ED course: Initial vitals-temp 97.8 F, initial HR 133, now in the 100's to 110's, RR 18, BP stable 129/85, SpO2 in the upper 90s on room air. Labs obtained including CMP and CBC were notable for sodium 134, chloride 92, glucose 241, anion gap 18, normal bicarb 23,, total protein 9.9, hemoglobin 12.6. Inflammatory markers elevated with CRP 8.8 and sed rate 110. Lactic acid normal 1.7 Imaging- - X-ray left foot shows  erosion of the tuft of the distal phalanx of the great toe compatible with osteomyelitis.  Soft tissue swelling of the great toe with soft tissue gas in the distal great toe in keeping with aggressive infection/tissue necrosis.  ED provider spoke with podiatry, Dr. Janit who agreed for consultation with the expectation of requiring amputation.  There is low suspicion for necrotizing soft tissue infection at this time given the absence of fever and leukocytosis.  Patient does have mild tachycardia and has been closely monitored. Patient was treated in the ED with 1 L bolus normal saline, IV vancomycin .  Patient is being admitted to the hospital for further evaluation and management left great toe osteomyelitis as outlined in detail below.   Review of Systems: As mentioned in the history of present illness. All other systems reviewed and are negative.   Past Medical History:  Diagnosis Date   Diabetes mellitus without complication (HCC)    Hypertension    History reviewed. No pertinent surgical history. Social History:  reports that he has quit smoking. He has never used smokeless tobacco. He reports current alcohol use. He reports that he does not use drugs.  Allergies[1]  Family History  Problem Relation Age of Onset   Diabetes Father    Hypertension Father     Prior to Admission medications  Medication Sig Start Date End Date Taking? Authorizing Provider  glucose blood test strip Use as instructed 03/30/20   Clark, Katherine K, NP  insulin  glargine (LANTUS  SOLOSTAR) 100 UNIT/ML Solostar Pen Inject 15 Units into the skin daily. 03/30/20   Clark, Katherine K, NP  Insulin  Pen Needle (PEN NEEDLES) 31G X 6 MM MISC Use daily with insulin  pen 03/30/20   Clark, Katherine K, NP  metFORMIN  (GLUCOPHAGE ) 1000 MG tablet Take 1 tablet (1,000 mg total) by mouth 2 (two) times daily with a meal. For diabetes. 03/30/20   Gretta Comer POUR, NP    Physical Exam: Vitals:   12/11/24 9287 12/11/24 0712  12/11/24 0717 12/11/24 0719  BP:    132/87  Pulse:    (!) 115  Resp:   18 18  Temp:   98.5 F (36.9 C) 98.5 F (36.9 C)  TempSrc:   Oral Oral  SpO2: 99%  99% 99%  Weight:  104 kg    Height:  6' 1 (1.854 m)     Bedside physical exam was performed by RN listed above. Below exam findings are based on their in person physical exam findings and my observations during virtual encounter.  General exam: awake, alert, no acute distress HEENT: Voice clear, hearing grossly normal  Respiratory system: CTAB, no wheezes, rales or rhonchi, normal respiratory effort.  On room air Cardiovascular system: normal S1/S2, tachycardic, regular rhythm, no peripheral edema, no murmurs heard Gastrointestinal system: soft, NT, ND, +bowel sounds. Central nervous system: A&O x3. no gross focal neurologic deficits, normal speech Extremities: Left foot photo as below, no lower extremity edema noted Skin: dry, intact, normal temperature per RN Psychiatry: normal mood, congruent affect, judgement and insight appear normal      Data Reviewed:  As reviewed in detail above  Assessment and Plan: * Osteomyelitis of great toe of left foot (HCC) Patient presents with worsening left great toe ulceration over several weeks now with malodorous drainage.  X-ray showing signs of osteomyelitis.  Patient is tachycardic but otherwise clinically not septic, no leukocytosis no fevers.  Treated with vancomycin  in the ED. --Podiatry is consulted, Dr. Janit -anticipate surgery tomorrow --MRI of left foot is pending --Broad-spectrum IV antibiotics: Vanco, cefepime  Flagyl  for now --Check blood cultures --Pain control and other supportive care as needed per orders --N.p.o. after midnight --Postop PT evaluation and activity restrictions per podiatry  Uncontrolled type 2 diabetes mellitus with hyperglycemia, without long-term current use of insulin  (HCC) Med history pending but patient's home regimen appears to be glargine 15  units daily and metformin  1000 mg twice daily. --Sliding scale NovoLog  -- Basal coverage 10 units daily for now --Titrate regimen as needed for goal 140-180 -- Check hemoglobin A1c      Advance Care Planning: CODE STATUS-full code  Consults: Podiatry  Family Communication: None present during virtual admission encounter  Severity of Illness: The appropriate patient status for this patient is INPATIENT. Inpatient status is judged to be reasonable and necessary in order to provide the required intensity of service to ensure the patient's safety. The patient's presenting symptoms, physical exam findings, and initial radiographic and laboratory data in the context of their chronic comorbidities is felt to place them at high risk for further clinical deterioration. Furthermore, it is not anticipated that the patient will be medically stable for discharge from the hospital within 2 midnights of admission.   * I certify that at the point of admission it is my clinical judgment that the patient will require inpatient hospital care spanning beyond 2 midnights from the point of admission due to high intensity of service, high risk for further deterioration and high frequency of surveillance required.*  Author: Burnard DELENA Cunning, DO 12/11/2024 9:50 AM  For on call review [email or web address].      [  1]  Allergies Allergen Reactions   Shellfish Allergy Anaphylaxis and Swelling    Face and throat swell   "

## 2024-12-11 NOTE — Assessment & Plan Note (Signed)
 Patient presents with worsening left great toe ulceration over several weeks now with malodorous drainage.  X-ray showing signs of osteomyelitis.  Patient is tachycardic but otherwise clinically not septic, no leukocytosis no fevers.  Treated with vancomycin  in the ED. --Podiatry is consulted, Dr. Janit -anticipate surgery tomorrow --MRI of left foot is pending --Broad-spectrum IV antibiotics: Vanco, cefepime  Flagyl  for now --Check blood cultures --Pain control and other supportive care as needed per orders --N.p.o. after midnight --Postop PT evaluation and activity restrictions per podiatry

## 2024-12-12 ENCOUNTER — Encounter (HOSPITAL_COMMUNITY): Payer: Self-pay | Admitting: Internal Medicine

## 2024-12-12 ENCOUNTER — Other Ambulatory Visit (HOSPITAL_COMMUNITY): Payer: Self-pay

## 2024-12-12 ENCOUNTER — Inpatient Hospital Stay (HOSPITAL_COMMUNITY): Payer: Self-pay | Admitting: Anesthesiology

## 2024-12-12 ENCOUNTER — Encounter (HOSPITAL_COMMUNITY)
Admission: EM | Disposition: A | Discharge status: Home / Self Care | Payer: Self-pay | Source: Home / Self Care | Attending: Internal Medicine

## 2024-12-12 ENCOUNTER — Inpatient Hospital Stay (HOSPITAL_COMMUNITY): Payer: Self-pay

## 2024-12-12 DIAGNOSIS — Z87891 Personal history of nicotine dependence: Secondary | ICD-10-CM

## 2024-12-12 DIAGNOSIS — E1169 Type 2 diabetes mellitus with other specified complication: Secondary | ICD-10-CM

## 2024-12-12 DIAGNOSIS — M869 Osteomyelitis, unspecified: Secondary | ICD-10-CM

## 2024-12-12 DIAGNOSIS — I10 Essential (primary) hypertension: Secondary | ICD-10-CM

## 2024-12-12 HISTORY — PX: AMPUTATION TOE: SHX6595

## 2024-12-12 LAB — GLUCOSE, CAPILLARY
Glucose-Capillary: 326 mg/dL — ABNORMAL HIGH (ref 70–99)
Glucose-Capillary: 265 mg/dL — ABNORMAL HIGH (ref 70–99)
Glucose-Capillary: 171 mg/dL — ABNORMAL HIGH (ref 70–99)
Glucose-Capillary: 201 mg/dL — ABNORMAL HIGH (ref 70–99)
Glucose-Capillary: 302 mg/dL — ABNORMAL HIGH (ref 70–99)
Glucose-Capillary: 292 mg/dL — ABNORMAL HIGH (ref 70–99)

## 2024-12-12 LAB — BASIC METABOLIC PANEL WITH GFR
Anion gap: 11 (ref 5–15)
BUN: 7 mg/dL (ref 6–20)
CO2: 25 mmol/L (ref 22–32)
Calcium: 8.8 mg/dL — ABNORMAL LOW (ref 8.9–10.3)
Chloride: 96 mmol/L — ABNORMAL LOW (ref 98–111)
Creatinine, Ser: 0.66 mg/dL (ref 0.61–1.24)
GFR, Estimated: >60 mL/min
Glucose, Bld: 315 mg/dL — ABNORMAL HIGH (ref 70–99)
Potassium: 3.9 mmol/L (ref 3.5–5.1)
Sodium: 132 mmol/L — ABNORMAL LOW (ref 135–145)

## 2024-12-12 LAB — CBC
HCT: 35.6 % — ABNORMAL LOW (ref 39.0–52.0)
Hemoglobin: 12.4 g/dL — ABNORMAL LOW (ref 13.0–17.0)
MCH: 28.7 pg (ref 26.0–34.0)
MCHC: 34.8 g/dL (ref 30.0–36.0)
MCV: 82.4 fL (ref 80.0–100.0)
Platelets: 300 K/uL (ref 150–400)
RBC: 4.32 MIL/uL (ref 4.22–5.81)
RDW: 11.7 % (ref 11.5–15.5)
WBC: 6.4 K/uL (ref 4.0–10.5)
nRBC: 0 % (ref 0.0–0.2)

## 2024-12-12 LAB — SURGICAL PCR SCREEN
MRSA, PCR: NEGATIVE
Staphylococcus aureus: NEGATIVE

## 2024-12-12 LAB — HEMOGLOBIN A1C
Hgb A1c MFr Bld: 13 % — ABNORMAL HIGH (ref 4.8–5.6)
Mean Plasma Glucose: 326.4 mg/dL

## 2024-12-12 SURGERY — AMPUTATION, TOE
Anesthesia: Monitor Anesthesia Care | Site: Toe | Laterality: Left

## 2024-12-12 MED ORDER — SODIUM CHLORIDE 0.9 % IR SOLN
Status: DC | PRN
  Administered 2024-12-12: 1000 mL

## 2024-12-12 MED ORDER — ACETAMINOPHEN 500 MG PO TABS: 1000.0000 mg | ORAL_TABLET | Freq: Once | ORAL | Status: AC

## 2024-12-12 MED ORDER — INSULIN ASPART 100 UNIT/ML IJ SOLN
0.0000 [IU] | Freq: Every day | INTRAMUSCULAR | Status: DC
  Administered 2024-12-12: 3 [IU] via SUBCUTANEOUS
  Filled 2024-12-12: qty 1

## 2024-12-12 MED ORDER — LIDOCAINE HCL (PF) 1 % IJ SOLN
INTRAMUSCULAR | Status: DC | PRN
  Administered 2024-12-12: 5 mL

## 2024-12-12 MED ORDER — ORAL CARE MOUTH RINSE: 15.0000 mL | Freq: Once | OROMUCOSAL | Status: AC

## 2024-12-12 MED ORDER — LIVING WELL WITH DIABETES BOOK
Freq: Once | Status: AC
  Filled 2024-12-12: qty 1

## 2024-12-12 MED ORDER — INSULIN GLARGINE 100 UNIT/ML ~~LOC~~ SOLN
20.0000 [IU] | Freq: Every day | SUBCUTANEOUS | Status: DC
  Administered 2024-12-12: 20 [IU] via SUBCUTANEOUS
  Filled 2024-12-12 (×2): qty 0.2

## 2024-12-12 MED ORDER — LACTATED RINGERS IV SOLN: INTRAVENOUS | Status: DC

## 2024-12-12 MED ORDER — INSULIN ASPART 100 UNIT/ML IJ SOLN
0.0000 [IU] | Freq: Three times a day (TID) | INTRAMUSCULAR | Status: DC
  Administered 2024-12-13: 3 [IU] via SUBCUTANEOUS
  Administered 2024-12-13: 5 [IU] via SUBCUTANEOUS
  Filled 2024-12-12: qty 5
  Filled 2024-12-12: qty 3

## 2024-12-12 MED ORDER — FENTANYL CITRATE (PF) 100 MCG/2ML IJ SOLN
INTRAMUSCULAR | Status: DC | PRN
  Administered 2024-12-12 (×2): 50 ug via INTRAVENOUS

## 2024-12-12 MED ORDER — MIDAZOLAM HCL (PF) 2 MG/2ML IJ SOLN
INTRAMUSCULAR | Status: DC | PRN
  Administered 2024-12-12: 2 mg via INTRAVENOUS

## 2024-12-12 MED ORDER — PROPOFOL 10 MG/ML IV BOLUS
INTRAVENOUS | Status: DC | PRN
  Administered 2024-12-12: 50 mg via INTRAVENOUS

## 2024-12-12 MED ORDER — ONDANSETRON HCL 4 MG/2ML IJ SOLN
INTRAMUSCULAR | Status: AC
  Filled 2024-12-12: qty 2

## 2024-12-12 MED ORDER — BUPIVACAINE HCL (PF) 0.5 % IJ SOLN
INTRAMUSCULAR | Status: AC
  Filled 2024-12-12: qty 30

## 2024-12-12 MED ORDER — OXYCODONE HCL 5 MG/5ML PO SOLN: 5.0000 mg | Freq: Once | ORAL | Status: DC | PRN

## 2024-12-12 MED ORDER — DEXTROSE 50 % IV SOLN
0.0000 mL | INTRAVENOUS | Status: DC | PRN
  Filled 2024-12-12: qty 50

## 2024-12-12 MED ORDER — AMISULPRIDE (ANTIEMETIC) 5 MG/2ML IV SOLN: 10.0000 mg | Freq: Once | INTRAVENOUS | Status: DC | PRN

## 2024-12-12 MED ORDER — SODIUM CHLORIDE 0.9 % IV SOLN: 12.5000 mg | INTRAVENOUS | Status: DC | PRN

## 2024-12-12 MED ORDER — OXYCODONE HCL 5 MG PO TABS: 5.0000 mg | ORAL_TABLET | Freq: Once | ORAL | Status: DC | PRN

## 2024-12-12 MED ORDER — BUPIVACAINE HCL (PF) 0.5 % IJ SOLN
INTRAMUSCULAR | Status: DC | PRN
  Administered 2024-12-12: 5 mL

## 2024-12-12 MED ORDER — FENTANYL CITRATE (PF) 100 MCG/2ML IJ SOLN: 25.0000 ug | INTRAMUSCULAR | Status: DC | PRN

## 2024-12-12 MED ORDER — LIDOCAINE HCL 1 % IJ SOLN
INTRAMUSCULAR | Status: AC
  Filled 2024-12-12: qty 20

## 2024-12-12 MED ORDER — INSULIN GLARGINE-YFGN 100 UNIT/ML ~~LOC~~ SOLN: 20.0000 [IU] | Freq: Every day | SUBCUTANEOUS | Status: DC

## 2024-12-12 MED ORDER — FENTANYL CITRATE (PF) 100 MCG/2ML IJ SOLN
INTRAMUSCULAR | Status: AC
  Filled 2024-12-12: qty 2

## 2024-12-12 MED ORDER — PROPOFOL 500 MG/50ML IV EMUL
INTRAVENOUS | Status: DC | PRN
  Administered 2024-12-12: 125 ug/kg/min via INTRAVENOUS

## 2024-12-12 MED ORDER — CHLORHEXIDINE GLUCONATE 0.12 % MT SOLN: 15.0000 mL | Freq: Once | OROMUCOSAL | Status: AC

## 2024-12-12 MED ORDER — CHLORHEXIDINE GLUCONATE 0.12 % MT SOLN
OROMUCOSAL | Status: AC
  Administered 2024-12-12: 15 mL via OROMUCOSAL
  Filled 2024-12-12: qty 15

## 2024-12-12 MED ORDER — MIDAZOLAM HCL 2 MG/2ML IJ SOLN
INTRAMUSCULAR | Status: AC
  Filled 2024-12-12: qty 2

## 2024-12-12 MED ORDER — ONDANSETRON HCL 4 MG/2ML IJ SOLN
INTRAMUSCULAR | Status: DC | PRN
  Administered 2024-12-12: 4 mg via INTRAVENOUS

## 2024-12-12 MED ORDER — INSULIN ASPART 100 UNIT/ML IJ SOLN
4.0000 [IU] | Freq: Three times a day (TID) | INTRAMUSCULAR | Status: DC
  Administered 2024-12-13 (×2): 4 [IU] via SUBCUTANEOUS
  Filled 2024-12-12 (×2): qty 4

## 2024-12-12 MED ORDER — INSULIN REGULAR(HUMAN) IN NACL 100-0.9 UT/100ML-% IV SOLN
INTRAVENOUS | Status: DC
  Administered 2024-12-12: 15 [IU]/h via INTRAVENOUS
  Filled 2024-12-12: qty 100

## 2024-12-12 MED ORDER — ACETAMINOPHEN 500 MG PO TABS
ORAL_TABLET | ORAL | Status: AC
  Administered 2024-12-12: 1000 mg via ORAL
  Filled 2024-12-12: qty 2

## 2024-12-12 SURGICAL SUPPLY — 35 items
BLADE LONG MED 31X9 (MISCELLANEOUS) IMPLANT
BLADE SURG 10 STRL SS (BLADE) ×1 IMPLANT
BLADE SURG 15 STRL LF DISP TIS (BLADE) ×1 IMPLANT
BNDG COHESIVE 3X5 TAN ST LF (GAUZE/BANDAGES/DRESSINGS) ×1 IMPLANT
BNDG COMPR ESMARK 4X3 LF (GAUZE/BANDAGES/DRESSINGS) ×1 IMPLANT
BNDG ELASTIC 3INX 5YD STR LF (GAUZE/BANDAGES/DRESSINGS) ×1 IMPLANT
BNDG ELASTIC 4INX 5YD STR LF (GAUZE/BANDAGES/DRESSINGS) IMPLANT
BNDG GAUZE DERMACEA FLUFF 4 (GAUZE/BANDAGES/DRESSINGS) IMPLANT
CHLORAPREP W/TINT 26 (MISCELLANEOUS) IMPLANT
DRSG ADAPTIC 3X8 NADH LF (GAUZE/BANDAGES/DRESSINGS) IMPLANT
DRSG XEROFORM 1X8 (GAUZE/BANDAGES/DRESSINGS) IMPLANT
ELECTRODE REM PT RTRN 9FT ADLT (ELECTROSURGICAL) ×1 IMPLANT
GAUZE PAD ABD 8X10 STRL (GAUZE/BANDAGES/DRESSINGS) IMPLANT
GAUZE SPONGE 2X2 STRL 8-PLY (GAUZE/BANDAGES/DRESSINGS) IMPLANT
GAUZE SPONGE 4X4 12PLY STRL (GAUZE/BANDAGES/DRESSINGS) ×1 IMPLANT
GAUZE STRETCH 2X75IN STRL (MISCELLANEOUS) ×1 IMPLANT
GAUZE XEROFORM 1X8 LF (GAUZE/BANDAGES/DRESSINGS) ×1 IMPLANT
GLOVE BIO SURGEON STRL SZ7.5 (GLOVE) ×1 IMPLANT
GLOVE BIOGEL PI IND STRL 7.5 (GLOVE) ×1 IMPLANT
GOWN STRL REUS W/ TWL LRG LVL3 (GOWN DISPOSABLE) ×2 IMPLANT
KIT BASIN OR (CUSTOM PROCEDURE TRAY) ×1 IMPLANT
NEEDLE HYPO 25X1 1.5 SAFETY (NEEDLE) ×1 IMPLANT
PACK ORTHO EXTREMITY (CUSTOM PROCEDURE TRAY) ×1 IMPLANT
PADDING CAST ABS COTTON 4X4 ST (CAST SUPPLIES) ×2 IMPLANT
SET HNDPC FAN SPRY TIP SCT (DISPOSABLE) IMPLANT
SOLN STERILE WATER BTL 1000 ML (IV SOLUTION) ×1 IMPLANT
SPIKE FLUID TRANSFER (MISCELLANEOUS) IMPLANT
STOCKINETTE 4X48 STRL (DRAPES) IMPLANT
SUT ETHILON 3 0 FSLX (SUTURE) IMPLANT
SUT PROLENE 3 0 PS 2 (SUTURE) IMPLANT
SUT PROLENE 4 0 PS 2 18 (SUTURE) IMPLANT
SYR CONTROL 10ML LL (SYRINGE) ×1 IMPLANT
TUBE CONNECTING 12X1/4 (SUCTIONS) IMPLANT
UNDERPAD 30X36 HEAVY ABSORB (UNDERPADS AND DIAPERS) ×1 IMPLANT
YANKAUER SUCT BULB TIP NO VENT (SUCTIONS) IMPLANT

## 2024-12-12 NOTE — Plan of Care (Signed)

## 2024-12-12 NOTE — Plan of Care (Signed)

## 2024-12-12 NOTE — Inpatient Diabetes Management (Addendum)
 Inpatient Diabetes Program Recommendations  AACE/ADA: New Consensus Statement on Inpatient Glycemic Control (2015)  Target Ranges:  Prepandial:   less than 140 mg/dL      Peak postprandial:   less than 180 mg/dL (1-2 hours)      Critically ill patients:  140 - 180 mg/dL   Lab Results  Component Value Date   GLUCAP 265 (H) 12/12/2024   HGBA1C 13.0 (H) 12/12/2024    Review of Glycemic Control  Latest Reference Range & Units 12/11/24 15:13 12/11/24 20:36 12/12/24 07:44 12/12/24 08:44  Glucose-Capillary 70 - 99 mg/dL 706 (H) 725 (H) 673 (H) 265 (H)   Diabetes history: DM 2 Outpatient Diabetes medications:  Metformin  1000 mg bid Lantus  15 units daily Current orders for Inpatient glycemic control:  Novolog  0-9 units tid with meals   Inpatient Diabetes Program Recommendations:    Please add Lantus  20 units daily.  Also consider adding Novolog  4 units tid with meals (meal coverage).  Will see patient today to discuss A1C.   Addendum 1420- spoke to patient and wife at bedside.  Discussed current A1C of 13% (indicating average blood sugar of 326 mg/dL).  Patient states he was on insulin  in the past however stopped taking it several years ago due to imrpved CBG's and A1C.  He states that he was working out and eating very well and was able to go off of insulin .  We discussed that DM management/lifestyle modifications can improve glycemic control and sometimes even means that insulin  can be stopped, however DM does not go away and will need continued care, monitoring and f/u with MD to make sure that A1C, CBG's, blood pressure and lipids are controlled.  He states that he eats pretty well and does not drink sweetened beverages. Discussed that insulin  will need to be resumed and reviewed insulin  pen with both he and his wife.  They both verbalized understanding.  He states he was on 2 types of insulin  previously (long acting plus rapid acting with meals).  He no longer has a glucometer and will need  new one at discharge.  I briefly discussed CGM with patient and he states I prefer doing it the old school way.  Will need f/u with PCP.  Also will order Living well with DM booklet for he and his wife.  Patient appreciative of visit. Will request benefits check for insulins/meter.   Thanks,  Randall Bullocks, RN, BC-ADM Inpatient Diabetes Coordinator Pager [phone]  (8a-5p)

## 2024-12-12 NOTE — Progress Notes (Signed)
 [facility] Progress Note Patient: Danny Crawford FMW:[id number] DOB: 07-16-1991  DOA: 12/10/2024 DOS: the patient was seen and examined on 12/12/2024  Brief Hospital Course: Patient with PMH of type II DM, uncontrolled, noncompliance, neuropathy, presents to the hospital with complaints of worsening ulcer on the left great toe. Found to have gangrene and osteomyelitis of the left great toe. Underwent amputation at MPJ level on 12/29 with podiatry.  Assessment and Plan: Osteomyelitis of the left great toe. As above status post amputation of the left great toe at MPJ level. Appreciate podiatry consult and assistance. Started on IV antibiotics. Blood cultures currently pending. Per podiatry margins are clear and therefore would recommend only 5 days of oral therapy Augmentin  from their point of view. So far blood cultures are negative.  Await 24-hour clearance.  Type 2 diabetes mellitus, uncontrolled with hyperglycemia without long-term insulin  use. Only takes metformin  at home. Hemoglobin A1c is 13. Diabetes cardiology consulted. Currently on sliding scale insulin  only.  Providing long-acting insulin  20 units as well as meal coverage. Patient educated about insulin  and therapy and importance of diet control. Will monitor.  Subjective: Pain well-controlled.  No nausea no vomiting no fever no chills.  Physical Exam: Clear to auscultation.  Some bowel sounds. No edema. No erythema or warmth of the lower extremity.  Data Reviewed: I have Reviewed nursing notes, Vitals, and Lab results. Since last encounter, pertinent lab results CBC and BMP   . I have ordered test including CBC and BMP  . I have discussed pt's care plan and test results with podiatry  .   Disposition: Status is: Inpatient Remains inpatient appropriate because: Monitor for postop recovery  enoxaparin  (LOVENOX ) injection 40 mg Start: 12/11/24 2100   Family Communication: No one at bedside Level of care:  Med-Surg   Vitals:   12/12/24 0945 12/12/24 1000 12/12/24 1337 12/12/24 1547  BP: 130/79 128/83 124/84 132/79  Pulse: (!) 101 (!) 102 (!) 107 (!) 101  Resp: 15 16 20 20   Temp:  99 F (37.2 C) 98.3 F (36.8 C) 98.5 F (36.9 C)  TempSrc:   Oral Oral  SpO2: 95% 94% 98% 97%  Weight:      Height:         Author: Yetta Blanch, MD 12/12/2024 7:32 PM  Please look on [email or web address] to find out who is on call.

## 2024-12-12 NOTE — Anesthesia Postprocedure Evaluation (Signed)
"   Anesthesia Post Note  Patient: Danny Crawford  Procedure(s) Performed: AMPUTATION, TOE (Left: Toe)     Patient location during evaluation: PACU Anesthesia Type: MAC Level of consciousness: awake and alert Pain management: pain level controlled Vital Signs Assessment: post-procedure vital signs reviewed and stable Respiratory status: spontaneous breathing, nonlabored ventilation and respiratory function stable Cardiovascular status: stable and blood pressure returned to baseline Anesthetic complications: no   No notable events documented.  Last Vitals:  Vitals:   12/12/24 0930 12/12/24 0945  BP: 136/86 130/79  Pulse: (!) 108 (!) 101  Resp: 16 15  Temp: 37.3 C   SpO2: 99% 95%    Last Pain:  Vitals:   12/12/24 0930  TempSrc:   PainSc: 0-No pain                 Debby FORBES Like      "

## 2024-12-12 NOTE — Progress Notes (Signed)
 Orthopedic Tech Progress Note Patient Details:  Danny Crawford [birth year] [id number]  Ortho Devices Type of Ortho Device: Postop shoe/boot Ortho Device/Splint Location: LLE Ortho Device/Splint Interventions: Ordered, Application, Adjustment, Removalfitted patient with a LARGE POST OP SHOE    Post Interventions Patient Tolerated: Well Instructions Provided: Care of device  Delanna CROME Pac 12/12/2024, 9:42 AM

## 2024-12-12 NOTE — Consult Note (Signed)
 "  PODIATRY CONSULTATION  NAME Danny Crawford MRN [id number] DOB [birth year] DOA 12/10/2024   Reason for consult:  Chief Complaint  Patient presents with   Toe Injury   Wound Check    Attending/Consulting physician: MYRTIS Blanch MD  History of present illness: Tarique Loveall Clinkscales is a 33 y.o. male with medical history significant of type 2 diabetes with neuropathy presented to [facility] ED for evaluation of a worsening ulcer of the left great toe worsening for the past few days.  He reports wearing steel toed boots at work, and due to neuropathy lacks some sensation to his toes.  He reports having to prior similar small scarred areas on his foot from work boots that healed well, but this time became a worsening ulcer.  Initially noticed wound several weeks ago, but recently worsening pain and developed malodorous drainage.  He denies fever/chills or feeling run down.  He denies other recent illnesses including cough, sore throat, congestion, abdominal pain nausea vomiting diarrhea.  He does report a isolated episode of vomiting on Wednesday that resolved.   Patient is new to our practice is not seen  podiatrist in the past for this toe wound.  he is unsure how it started thinks possibly due to steel toed boots.  Has seen the wound for several weeks has had worsening malodor and drainage.  We discussed findings from the MRI and discussion re: surgical intervention including amputation of the great toe.  He is understanding of the need for this.    Past Medical History:  Diagnosis Date   Diabetes mellitus without complication (HCC)    Hypertension        Latest Ref Rng & Units 12/12/2024    6:10 AM 12/11/2024    8:50 PM 12/10/2024    7:15 PM  CBC  WBC 4.0 - 10.5 K/uL 6.4  7.4  9.2   Hemoglobin 13.0 - 17.0 g/dL 87.5  87.0  87.3   Hematocrit 39.0 - 52.0 % 35.6  37.0  36.9   Platelets 150 - 400 K/uL 300  315  333        Latest Ref Rng & Units 12/11/2024    8:50 PM 12/10/2024    7:15 PM  03/30/2020    9:18 AM  BMP  Glucose 70 - 99 mg/dL  758  646   BUN 6 - 20 mg/dL  8  11   Creatinine 9.38 - 1.24 mg/dL 9.30  9.25  9.09   Sodium 135 - 145 mmol/L  134  134   Potassium 3.5 - 5.1 mmol/L  3.9  4.3   Chloride 98 - 111 mmol/L  92  97   CO2 22 - 32 mmol/L  23  31   Calcium 8.9 - 10.3 mg/dL  89.9  9.8       Physical Exam: Lower Extremity Exam  Left hallux with circular ulceration at the plantar aspect of the toe with maceration and purulent drainage Malodor is present.  Skin changes plantarly extending to the plantar base of the great toe  DP and PT pulses 2+ on the left foot  Sensation diminished to light touch  Edema present to the forefoot and midfoot   ASSESSMENT/PLAN OF CARE 33 y.o. male with PMHx significant for type 2 diabetes with neuropathy  with ulceration and osteomyelitis of the left great toe  - N.p.o. for OR this morning plan for left great toe amputation to the MPJ level.  Patient agrees to proceed after discussion of  risk benefits alternatives - Continue IV abx broad spectrum pending further culture data - Anticoagulation: Okay to resume postoperatively if held - Wound care: None required postop - WB status: Weightbearing as tolerated in postop shoe following surgery - Will continue to follow   Thank you for the consult.  Please contact me directly with any questions or concerns.           [name], DPM [facility] / [facility]    [address] [place], [place] [address]                Office [phone]  Fax [phone]     "

## 2024-12-12 NOTE — Transfer of Care (Signed)
 Immediate Anesthesia Transfer of Care Note  Patient: Danny Crawford  Procedure(s) Performed: AMPUTATION, TOE (Left: Toe)  Patient Location: PACU  Anesthesia Type:MAC  Level of Consciousness: awake, alert , and oriented  Airway & Oxygen Therapy: Patient Spontanous Breathing and Patient connected to face mask oxygen  Post-op Assessment: Report given to RN and Post -op Vital signs reviewed and stable  Post vital signs: Reviewed and stable  Last Vitals:  Vitals Value Taken Time  BP 136/86 12/12/24 09:30  Temp 37.3 C 12/12/24 09:30  Pulse 107 12/12/24 09:33  Resp 25 12/12/24 09:33  SpO2 96 % 12/12/24 09:33  Vitals shown include unfiled device data.  Last Pain:  Vitals:   12/12/24 0833  TempSrc:   PainSc: 0-No pain         Complications: No notable events documented.

## 2024-12-12 NOTE — Evaluation (Signed)
 Physical Therapy Evaluation Patient Details Name: Danny Crawford MRN: [id number] DOB: 05/18/1991 Today's Date: 12/12/2024  History of Present Illness  33 y.o. male admitted 12/27 with osteomyelitis of left great toe. S/p Amputation of left great toe at MPJ level 12/29. PMH: DM II, HTN.  Clinical Impression  Patient is s/p above surgery presenting with functional limitations due to the deficits listed below (see PT Problem List). Educated on transfer techniques and gait using crutches today. Requires supervision for safety and cues for technique/sequencing with this device. Would like to practice navigating steps tomorrow prior to d/c. Will plan follow-up for stair navigation training. Patient will benefit from acute skilled PT to increase their independence and safety with mobility to facilitate discharge.         If plan is discharge home, recommend the following: A little help with walking and/or transfers;A little help with bathing/dressing/bathroom;Assistance with cooking/housework;Assist for transportation;Help with stairs or ramp for entrance   Can travel by private vehicle        Equipment Recommendations Crutches  Recommendations for Other Services       Functional Status Assessment Patient has had a recent decline in their functional status and demonstrates the ability to make significant improvements in function in a reasonable and predictable amount of time.     Precautions / Restrictions Precautions Precautions: Fall Recall of Precautions/Restrictions: Intact Required Braces or Orthoses: Other Brace Other Brace: post op shoe, Lt Restrictions Weight Bearing Restrictions Per Provider Order: Yes LLE Weight Bearing Per Provider Order: Weight bearing as tolerated (for short distances/transfers)      Mobility  Bed Mobility Overal bed mobility: Modified Independent             General bed mobility comments: extra time no assist.    Transfers Overall transfer  level: Needs assistance Equipment used: Crutches Transfers: Sit to/from Stand Sit to Stand: Supervision           General transfer comment: Educated on transitions with crutches (bil in Lt hand to rise and sit) Transitioned with supervision for safety to bil hands with crutches. majorty WB through RLE, Lt post-op shoe in place.    Ambulation/Gait Ambulation/Gait assistance: Supervision Gait Distance (Feet): 90 Feet Assistive device: Crutches Gait Pattern/deviations: Step-to pattern, Antalgic Gait velocity: dec Gait velocity interpretation: <1.8 ft/sec, indicate of risk for recurrent falls   General Gait Details: Educated on safe navigation techniques in room and hallway using crutches. Currently with step-to pattern. Mild instability but self corrects. Cues for awareness and safety. Practiced backing up and turning with Supervision for safety throughout. HR to 144. Denied dizziness or c/p.  Stairs Stairs:  (Demonstrated - to practice tomorrow.)          Wheelchair Mobility     Tilt Bed    Modified Rankin (Stroke Patients Only)       Balance Overall balance assessment: Needs assistance Sitting-balance support: No upper extremity supported, Feet supported Sitting balance-Leahy Scale: Good     Standing balance support: Single extremity supported, Reliant on assistive device for balance Standing balance-Leahy Scale: Poor                               Pertinent Vitals/Pain Pain Assessment Pain Assessment: 0-10 Pain Score: 0-No pain Pain Intervention(s): Monitored during session    Home Living Family/patient expects to be discharged to:: Private residence Living Arrangements: Spouse/significant other Available Help at Discharge: Family Type of Home: House Home  Access: Stairs to enter Entrance Stairs-Rails: Doctor, General Practice of Steps: 4   Home Layout: One level Home Equipment: None      Prior Function Prior Level of Function :  Independent/Modified Independent;Driving;Working/employed             Mobility Comments: Ind ADLs Comments: Works at [employer]     Extremity/Trunk Assessment   Upper Extremity Assessment Upper Extremity Assessment: Defer to OT evaluation    Lower Extremity Assessment Lower Extremity Assessment: LLE deficits/detail LLE Deficits / Details: bandaged. LLE Sensation: decreased light touch;history of peripheral neuropathy       Communication   Communication Communication: No apparent difficulties    Cognition Arousal: Alert Behavior During Therapy: WFL for tasks assessed/performed   PT - Cognitive impairments: No apparent impairments                         Following commands: Intact       Cueing Cueing Techniques: Verbal cues     General Comments General comments (skin integrity, edema, etc.): Educated on WB precautions, post-op shoe use, and safety techniques with mobility.    Exercises     Assessment/Plan    PT Assessment Patient needs continued PT services  PT Problem List Decreased range of motion;Decreased activity tolerance;Decreased balance;Decreased mobility;Decreased knowledge of use of DME;Impaired sensation       PT Treatment Interventions DME instruction;Gait training;Stair training;Functional mobility training;Therapeutic activities;Therapeutic exercise;Balance training;Neuromuscular re-education;Patient/family education;Modalities    PT Goals (Current goals can be found in the Care Plan section)  Acute Rehab PT Goals Patient Stated Goal: Go home PT Goal Formulation: With patient Time For Goal Achievement: 12/26/24 Potential to Achieve Goals: Good    Frequency Min 2X/week     Co-evaluation               AM-PAC PT 6 Clicks Mobility  Outcome Measure Help needed turning from your back to your side while in a flat bed without using bedrails?: None Help needed moving from lying on your back to sitting on the side of a flat bed  without using bedrails?: None Help needed moving to and from a bed to a chair (including a wheelchair)?: A Little Help needed standing up from a chair using your arms (e.g., wheelchair or bedside chair)?: A Little Help needed to walk in hospital room?: A Little Help needed climbing 3-5 steps with a railing? : A Little 6 Click Score: 20    End of Session Equipment Utilized During Treatment: Gait belt Activity Tolerance: Patient tolerated treatment well Patient left: in bed;with call bell/phone within reach;with bed alarm set;with family/visitor present   PT Visit Diagnosis: Unsteadiness on feet (R26.81);Other abnormalities of gait and mobility (R26.89);Difficulty in walking, not elsewhere classified (R26.2)    Time: 8866-8841 PT Time Calculation (min) (ACUTE ONLY): 25 min   Charges:   PT Evaluation $PT Eval Low Complexity: 1 Low PT Treatments $Gait Training: 8-22 mins PT General Charges $$ ACUTE PT VISIT: 1 Visit         Leontine Roads, PT, DPT [facility]  Rehabilitation Services Physical Therapist Office: [phone] Website: [email or web address]   [name] 12/12/2024, 12:52 PM

## 2024-12-12 NOTE — Op Note (Signed)
 Full Operative Report  Date of Operation: 8:21 AM, 12/12/2024   Patient: Danny Crawford - 33 y.o. male  Surgeon: Malvin Marsa FALCON, DPM   Assistant: None  Diagnosis: osteomyelitis of left great toe  Procedure:  1. Amputation of left great toe at MPJ level    Anesthesia: Monitor Anesthesia Care  No responsible provider has been recorded for the case.  No anesthesia staff entered.   Estimated Blood Loss: Minimal   Hemostasis: 1) Anatomical dissection, mechanical compression, electrocautery 2) no tourniquet was used  Implants: * No implants in log *  Materials: prolene 3-0  Injectables: 1) Pre-operatively: 10 cc of 50:50 mixture 1%lidocaine  plain and 0.5% marcaine  plain 2) Post-operatively: None   Specimens: - Pathology: Left great toe - Microbiology: Deep tissue culture left great toe   Antibiotics: IV antibiotics given per schedule on the floor  Drains: None  Complications: Patient tolerated the procedure well without complication.   Operative findings: As below in detailed report  Indications for Procedure: Danny Crawford presents to [facility], [place] with a chief complaint of infected ulceration and underlying osteomyelitis of the left great toe The patient has failed conservative treatments of various modalities. At this time the patient has elected to proceed with surgical correction. All alternatives, risks, and complications of the procedures were thoroughly explained to the patient. Patient exhibits appropriate understanding of all discussion points and informed consent was signed and obtained in the chart with no guarantees to surgical outcome given or implied.  Description of Procedure: Patient was brought to the operating room. Patient remained on their hospital bed in the supine position. A surgical timeout was performed and all members of the operating room, the procedure, and the surgical site were identified. anesthesia occurred as  per anesthesia record. Local anesthetic as previously described was then injected about the operative field in a local infiltrative block.  The operative lower extremity as noted above was then prepped and draped in the usual sterile manner. The following procedure then began.  Attention was directed to the first digit on the LEFT foot. A full-thickness incision encompassing the entire digit was made using a #15 blade. Dissection was carried down to bone. The toe was secured with a towel clamp, further dissected in its entirety, and disarticulated at the MPJ and passed to the back table as a gross specimen. This was then labled and sent to pathology. The bone was noted to be soft and eroded, and consistent with osteomyelitis. A deep tissue culture was harvested from the ulceration site and sent for micro. All remaining necrotic and devitalized soft tissue structures were visualized and dissected away using sharp and dull dissection. Care was taken to protect all neurovascular structures throughout the dissection. All bleeders were cauterized as necessary.  The area was then flushed with copious amounts of sterile saline. Then using the suture materials previously described, the site was closed in anatomic layers and the skin was well approximated under minimal tension.  The surgical site was then dressed with xeroform. The patient tolerated both the procedure and anesthesia well with vital signs stable throughout. The patient was transferred in good condition and all vital signs stable  from the OR to recovery under the discretion of anesthesia.  Condition: Vital signs stable, neurovascular status unchanged from preoperative   Surgical plan:  Expect clean margin, good bleeding at amputation site. Rec 5 days augmentin  from surgery. Wbat in post op shoe for short distances. Ok for [employer] home tmrw am  from my standpoing f/u next week Tuesday in office for dsg change  The patient will be WBAT in a post op shoe to  the operative limb until further instructed. The dressing is to remain clean, dry, and intact. Will continue to follow unless noted elsewhere.   Marsa Honour, DPM [facility]

## 2024-12-12 NOTE — Progress Notes (Signed)
.. ° ° °  PROCEDURAL EXPEDITER PROGRESS NOTE  Patient Name: Danny Crawford  DOB:June 14, 1991 Date of Admission: 12/10/2024  Date of Assessment:12/12/2024   -------------------------------------------------------------------------------------------------------------------   Brief clinical summary: Pt to OR today for L toe amputation  Orders in place:  No   Communication with surgical team if no orders: IB MD  Labs, test, and orders reviewed: Y  Requires surgical clearance:  No  Barriers noted: N/A   -------------------------------------------------------------------------------------------------------------------  [facility], [place], [place] Please contact us  directly via secure chat (search for [facility]) or by calling us  at [phone] [facility]).

## 2024-12-13 ENCOUNTER — Other Ambulatory Visit (HOSPITAL_COMMUNITY): Payer: Self-pay

## 2024-12-13 ENCOUNTER — Encounter (HOSPITAL_COMMUNITY): Payer: Self-pay | Admitting: Podiatry

## 2024-12-13 LAB — GLUCOSE, CAPILLARY
Glucose-Capillary: 248 mg/dL — ABNORMAL HIGH (ref 70–99)
Glucose-Capillary: 194 mg/dL — ABNORMAL HIGH (ref 70–99)

## 2024-12-13 LAB — CBC
HCT: 35.1 % — ABNORMAL LOW (ref 39.0–52.0)
Hemoglobin: 11.8 g/dL — ABNORMAL LOW (ref 13.0–17.0)
MCH: 28.3 pg (ref 26.0–34.0)
MCHC: 33.6 g/dL (ref 30.0–36.0)
MCV: 84.2 fL (ref 80.0–100.0)
Platelets: 329 K/uL (ref 150–400)
RBC: 4.17 MIL/uL — ABNORMAL LOW (ref 4.22–5.81)
RDW: 11.8 % (ref 11.5–15.5)
WBC: 6.4 K/uL (ref 4.0–10.5)
nRBC: 0 % (ref 0.0–0.2)

## 2024-12-13 LAB — BASIC METABOLIC PANEL WITH GFR
Anion gap: 8 (ref 5–15)
BUN: 6 mg/dL (ref 6–20)
CO2: 27 mmol/L (ref 22–32)
Calcium: 8.9 mg/dL (ref 8.9–10.3)
Chloride: 100 mmol/L (ref 98–111)
Creatinine, Ser: 0.59 mg/dL — ABNORMAL LOW (ref 0.61–1.24)
GFR, Estimated: >60 mL/min
Glucose, Bld: 245 mg/dL — ABNORMAL HIGH (ref 70–99)
Potassium: 3.9 mmol/L (ref 3.5–5.1)
Sodium: 134 mmol/L — ABNORMAL LOW (ref 135–145)

## 2024-12-13 LAB — MAGNESIUM: Magnesium: 1.8 mg/dL (ref 1.7–2.4)

## 2024-12-13 MED ORDER — HYDROCODONE-ACETAMINOPHEN 5-325 MG PO TABS
1.0000 | ORAL_TABLET | Freq: Four times a day (QID) | ORAL | 0 refills | Status: AC | PRN
  Filled 2024-12-13: qty 20, 5d supply, fill #0

## 2024-12-13 MED ORDER — SENNOSIDES-DOCUSATE SODIUM 8.6-50 MG PO TABS
1.0000 | ORAL_TABLET | Freq: Every evening | ORAL | 0 refills | Status: AC | PRN
  Filled 2024-12-13: qty 10, 10d supply, fill #0

## 2024-12-13 MED ORDER — LANCET DEVICE MISC
1.0000 | 0 refills | Status: AC
  Filled 2024-12-13: qty 1, fill #0

## 2024-12-13 MED ORDER — DOXYCYCLINE HYCLATE 100 MG PO TABS
100.0000 mg | ORAL_TABLET | Freq: Two times a day (BID) | ORAL | Status: DC
  Administered 2024-12-13: 100 mg via ORAL
  Filled 2024-12-13: qty 1

## 2024-12-13 MED ORDER — GLUCAGON EMERGENCY 1 MG IJ SOLR
1.0000 mg | INTRAMUSCULAR | 0 refills | Status: AC | PRN
  Filled 2024-12-13: qty 2, 30d supply, fill #0

## 2024-12-13 MED ORDER — LANCET DEVICE MISC: 1.0000 | 0 refills | Status: DC

## 2024-12-13 MED ORDER — LANCETS MISC: 1.0000 | 0 refills | Status: DC

## 2024-12-13 MED ORDER — METFORMIN HCL 1000 MG PO TABS
1000.0000 mg | ORAL_TABLET | Freq: Two times a day (BID) | ORAL | 0 refills | Status: DC
  Filled 2024-12-13: qty 60, 30d supply, fill #0

## 2024-12-13 MED ORDER — INSULIN PEN NEEDLE 32G X 4 MM MISC
1.0000 | 0 refills | Status: AC
  Filled 2024-12-13: qty 100, 25d supply, fill #0

## 2024-12-13 MED ORDER — DOXYCYCLINE HYCLATE 100 MG PO TABS: 100.0000 mg | ORAL_TABLET | Freq: Two times a day (BID) | ORAL | 0 refills | Status: DC

## 2024-12-13 MED ORDER — METFORMIN HCL 1000 MG PO TABS: 1000.0000 mg | ORAL_TABLET | Freq: Two times a day (BID) | ORAL | 0 refills | Status: DC

## 2024-12-13 MED ORDER — ACCU-CHEK SOFTCLIX LANCETS MISC
1.0000 | 0 refills | Status: AC
  Filled 2024-12-13: qty 100, 25d supply, fill #0

## 2024-12-13 MED ORDER — SENNOSIDES-DOCUSATE SODIUM 8.6-50 MG PO TABS: 1.0000 | ORAL_TABLET | Freq: Every evening | ORAL | 0 refills | Status: DC | PRN

## 2024-12-13 MED ORDER — AMOXICILLIN-POT CLAVULANATE 875-125 MG PO TABS
1.0000 | ORAL_TABLET | Freq: Two times a day (BID) | ORAL | Status: DC
  Administered 2024-12-13: 1 via ORAL
  Filled 2024-12-13: qty 1

## 2024-12-13 MED ORDER — BLOOD GLUCOSE MONITOR SYSTEM W/DEVICE KIT
1.0000 | PACK | 0 refills | Status: AC
  Filled 2024-12-13: qty 1, 30d supply, fill #0

## 2024-12-13 MED ORDER — AMOXICILLIN-POT CLAVULANATE 875-125 MG PO TABS: 1.0000 | ORAL_TABLET | Freq: Two times a day (BID) | ORAL | 0 refills | Status: DC

## 2024-12-13 MED ORDER — DOXYCYCLINE HYCLATE 100 MG PO TABS
100.0000 mg | ORAL_TABLET | Freq: Two times a day (BID) | ORAL | 0 refills | Status: AC
  Filled 2024-12-13: qty 8, 4d supply, fill #0

## 2024-12-13 MED ORDER — INSULIN ASPART 100 UNIT/ML FLEXPEN: 4.0000 [IU] | PEN_INJECTOR | Freq: Three times a day (TID) | SUBCUTANEOUS | 0 refills | Status: DC

## 2024-12-13 MED ORDER — INSULIN ASPART 100 UNIT/ML FLEXPEN
4.0000 [IU] | PEN_INJECTOR | Freq: Three times a day (TID) | SUBCUTANEOUS | 0 refills | Status: AC
  Filled 2024-12-13: qty 3, 25d supply, fill #0

## 2024-12-13 MED ORDER — GLUCAGON EMERGENCY 1 MG IJ SOLR: 1.0000 mg | INTRAMUSCULAR | 0 refills | Status: DC | PRN

## 2024-12-13 MED ORDER — BASAGLAR KWIKPEN 100 UNIT/ML ~~LOC~~ SOPN
22.0000 [IU] | PEN_INJECTOR | Freq: Every day | SUBCUTANEOUS | 0 refills | Status: AC
  Filled 2024-12-13: qty 6, 27d supply, fill #0

## 2024-12-13 MED ORDER — BASAGLAR KWIKPEN 100 UNIT/ML ~~LOC~~ SOPN: 22.0000 [IU] | PEN_INJECTOR | Freq: Every day | SUBCUTANEOUS | 0 refills | Status: DC

## 2024-12-13 MED ORDER — HYDROCODONE-ACETAMINOPHEN 5-325 MG PO TABS: 1.0000 | ORAL_TABLET | Freq: Four times a day (QID) | ORAL | 0 refills | Status: DC | PRN

## 2024-12-13 MED ORDER — PEN NEEDLES 31G X 5 MM MISC: 1.0000 | 0 refills | Status: DC

## 2024-12-13 MED ORDER — BLOOD GLUCOSE TEST VI STRP
1.0000 | ORAL_STRIP | 0 refills | Status: DC
  Filled 2024-12-13: qty 100, 25d supply, fill #0

## 2024-12-13 MED ORDER — AMOXICILLIN-POT CLAVULANATE 875-125 MG PO TABS
1.0000 | ORAL_TABLET | Freq: Two times a day (BID) | ORAL | 0 refills | Status: DC
  Filled 2024-12-13: qty 8, 4d supply, fill #0

## 2024-12-13 MED ORDER — BLOOD GLUCOSE MONITORING SUPPL DEVI: 1.0000 | 0 refills | Status: DC

## 2024-12-13 MED ORDER — BLOOD GLUCOSE TEST VI STRP: 1.0000 | ORAL_STRIP | 0 refills | Status: DC

## 2024-12-13 NOTE — Discharge Summary (Signed)
 " Physician Discharge Summary   Patient: Danny Crawford MRN: [id number] DOB: 13-May-1991  Admit date:     12/10/2024  Discharge date: 12/13/2024  Discharge Physician: Yetta Blanch  PCP: Gretta Comer POUR, NP  Disposition: Home Recommendations at discharge: Follow-up with podiatry and palliative PCP.   Follow-up Information     [facility] [place] Follow up.   Specialty: Family Medicine Why: Please call the office and schedule a hospital follow up in the next 7-10 days. Contact information: [address] [place] [address] [phone]                Hospital Course: Patient with PMH of type II DM, uncontrolled, noncompliance, neuropathy, presents to the hospital with complaints of worsening ulcer on the left great toe. Found to have gangrene and osteomyelitis of the left great toe. Underwent amputation at MPJ level on 12/29 with podiatry.   Assessment and Plan: Osteomyelitis of the left great toe. As above status post amputation of the left great toe at MPJ level. Appreciate podiatry consult and assistance. Started on IV antibiotics. Blood cultures currently negative for 48 hours. Per podiatry margins are clear and therefore would recommend only 5 days of oral therapy Augmentin  from their point of view.   Type 2 diabetes mellitus, uncontrolled with hyperglycemia without long-term insulin  use. Only takes metformin  at home. Hemoglobin A1c is 13. Diabetes  consulted. Patient educated about insulin  and therapy and importance of diet control. Will discharge with long-acting insulin  and Premeal coverage. Close follow-up with PCP recommended. Continue with metformin .  Pain control - [place]  Controlled Substance Reporting System database was reviewed. and patient was instructed, not to drive, operate heavy machinery, perform activities at heights, swimming or participation in water activities or provide baby-sitting services while on  Pain, Sleep and Anxiety Medications; until their outpatient Physician has advised to do so again. Also recommended to not to take more than prescribed Pain, Sleep and Anxiety Medications.   Consultants:  Podiatry  Procedures performed:  Amputation of the great toe  DISCHARGE MEDICATION: Allergies as of 12/13/2024       Reactions   Shellfish Allergy Anaphylaxis, Swelling   Face and throat swell        Medication List     TAKE these medications    Accu-Chek Guide Test test strip Generic drug: glucose blood Use up to four times daily as directed. (FOR ICD-10 E10.9, E11.9).   Accu-Chek Guide w/Device Kit Use up to four times daily as directed. (FOR ICD-10 E10.9, E11.9).   Accu-Chek Softclix Lancets lancets Use up to four times daily as directed. (FOR ICD-10 E10.9, E11.9).   amoxicillin -clavulanate 875-125 MG tablet Commonly known as: AUGMENTIN  Take 1 tablet by mouth 2 (two) times daily for 4 days.   Basaglar  KwikPen 100 UNIT/ML Inject 22 Units into the skin at bedtime. May substitute as needed per insurance.   doxycycline  100 MG tablet Commonly known as: VIBRA -TABS Take 1 tablet (100 mg total) by mouth every 12 (twelve) hours for 4 days.   glucagon Emergency 1 MG Solr Inject 1 mg into the skin as needed for up to 2 doses (Severe low blood sugar).   HYDROcodone -acetaminophen  5-325 MG tablet Commonly known as: NORCO/VICODIN Take 1 tablet by mouth every 6 (six) hours as needed for moderate pain (pain score 4-6) or severe pain (pain score 7-10).   Lancet Device Misc 1 each by Does not apply route as directed. Dispense based on patient and insurance preference.  Use up to four times daily as directed. (FOR ICD-10 E10.9, E11.9).   metFORMIN  1000 MG tablet Commonly known as: GLUCOPHAGE  Take 1 tablet (1,000 mg total) by mouth 2 (two) times daily with a meal.   NovoLOG  FlexPen 100 UNIT/ML FlexPen Generic drug: insulin  aspart Inject 4 Units into the skin 3 (three) times  daily with meals. Only take if eating a meal AND Blood Glucose (BG) is 80 or higher.   Stool Softener/Laxative 50-8.6 MG tablet Generic drug: senna-docusate Take 1 tablet by mouth at bedtime as needed for mild constipation.   TechLite Pen Needles 32G X 4 MM Misc Generic drug: Insulin  Pen Needle Use up to four times daily as directed. (FOR ICD-10 E10.9, E11.9).               Discharge Care Instructions  (From admission, onward)           Start     Ordered   12/13/24 0000  Leave dressing on - Keep it clean, dry, and intact until clinic visit        12/13/24 1324            Diet recommendation: Carb modified diet  Discharge Exam: Vitals:   12/12/24 2326 12/12/24 2341 12/13/24 0528 12/13/24 0807  BP: 125/83 131/74 131/84 (!) 117/90  Pulse: (!) 107 (!) 103 96 97  Resp: 18 18 18 14   Temp: 99.4 F (37.4 C) 98.4 F (36.9 C) 98.2 F (36.8 C)   TempSrc: Oral     SpO2: 97% 97% 98% 98%  Weight:      Height:        Filed Weights   12/11/24 0712 12/12/24 0805  Weight: 104 kg 104 kg   General: in Mild distress, No Rash Cardiovascular: S1 and S2 Present, No Murmur Respiratory: Good respiratory effort, Bilateral Air entry present. No Crackles, No wheezes Abdomen: Bowel Sound present, No tenderness Extremities: No edema Neuro: Alert and oriented x3, no new focal deficit  Condition at discharge: stable  The results of significant diagnostics from this hospitalization (including imaging, microbiology, ancillary and laboratory) are listed below for reference.   Imaging Studies: DG Foot 2 Views Left Result Date: 12/12/2024 CLINICAL DATA:  Postop. EXAM: DG FOOT 2V*L* COMPARISON:  Preoperative radiograph FINDINGS: Resection of the great toe. The first metatarsal head is smooth. Expected postoperative changes in the soft tissues. No fracture, erosive or bony destructive changes of the remaining foot. Mild generalized soft tissue edema. IMPRESSION: Interval resection of the  great toe. Electronically Signed   By: Andrea Gasman M.D.   On: 12/12/2024 13:49   MR FOOT LEFT WO CONTRAST Result Date: 12/12/2024 EXAM: MRI of the Left Foot without contrast. 12/11/2024 06:27:00 PM TECHNIQUE: Multiplanar multisequence MRI of the left foot was performed without the administration of intravenous contrast. COMPARISON: None available. CLINICAL HISTORY: Osteomyelitis of the left great toe. FINDINGS: LIMITATIONS/ARTIFACTS: Motion artifact is present, reducing diagnostic sensitivity and specificity. LISFRANC JOINT: Mild degenerative spurring at the Lisfranc joint. Lisfranc ligament intact. No significant Lisfranc interval widening or significant periligamentous edema. BONE MARROW: Active osteomyelitis of the phalanges of the great toe observed, most severely involving the distal phalanx, with extensive marrow edema, associated cortical indistinctness, and irregular surrounding subcutaneous gas distally in the great toe. Bifid and moderately separated segments of the medial sesamoid of the great toe without substantial internal marrow edema. GREATER AND LESSER MTP JOINTS: Small effusion of the interphalangeal joint of the great toe. Small dorsal effusion of the 1st  digit metatarsophalangeal joint. No osseous erosions. No significant degenerative changes. Normal alignment. SOFT TISSUES: Probable draining sinus tract/ulceration along the distal to lateral portion of the great toe. Extensive surrounding cutaneous and subcutaneous edema in the great toe compatible with cellulitis. Low grade nonspecific subcutaneous edema along the plantar margins of the bases of the 4th and 5th toes probably incidental. Nonspecific dorsal subcutaneous edema in the forefoot. Mild regional muscular atrophy. TENDONS: Visualized flexor and extensor tendons are intact without tenosynovitis. IMPRESSION: 1. Active osteomyelitis of the phalanges of the left great toe, most severe in the distal phalanx, with associated  subcutaneous gas and a probable draining sinus tract or ulceration. 2. Cellulitis of the left great toe. 3. Small effusion of the interphalangeal joint of the great toe and small effusion of the first metatarsophalangeal joint. 4. Motion artifact limits evaluation. 5. Mild degenerative spurring at the Lisfranc joint. 6. Bifid medial sesamoid of the great toe, mild regional muscular atrophy, and additional nonspecific forefoot soft tissue edema. Electronically signed by: Ryan Salvage MD 12/12/2024 08:43 AM EST RP Workstation: [id number]   DG Foot Complete Left Result Date: 12/10/2024 EXAM: 3 OR MORE VIEW(S) XRAY OF THE LEFT FOOT 12/10/2024 07:09:00 PM COMPARISON: None available. CLINICAL HISTORY: Wound infection FINDINGS: BONES AND JOINTS: Erosion of the tuft of the distal phalanx of the great toe, compatible with osteomyelitis. SOFT TISSUES: Soft tissue swelling of the great toe with soft tissue gas in the distal great toe. IMPRESSION: 1. Erosion of the tuft of the distal phalanx of the great toe, compatible with osteomyelitis. 2. Soft tissue swelling of the great toe with soft tissue gas in the distal great toe in keeping with aggressive infection/ tissue necrosis . Electronically signed by: Dorethia Molt MD 12/10/2024 08:20 PM EST RP Workstation: [id number]    Microbiology: Results for orders placed or performed during the hospital encounter of 12/10/24  Culture, blood (Routine X 2) w Reflex to ID Panel     Status: None (Preliminary result)   Collection Time: 12/11/24  5:37 PM   Specimen: BLOOD  Result Value Ref Range Status   Specimen Description BLOOD BLOOD LEFT HAND  Final   Special Requests   Final    BOTTLES DRAWN AEROBIC AND ANAEROBIC Blood Culture adequate volume   Culture   Final    NO GROWTH 2 DAYS Performed at [facility], [address], [place], [place] [address]    Report Status PENDING  Incomplete  Culture, blood (Routine X 2) w Reflex to ID Panel     Status: None  (Preliminary result)   Collection Time: 12/11/24  5:39 PM   Specimen: BLOOD  Result Value Ref Range Status   Specimen Description BLOOD BLOOD RIGHT HAND  Final   Special Requests   Final    BOTTLES DRAWN AEROBIC AND ANAEROBIC Blood Culture adequate volume   Culture   Final    NO GROWTH 2 DAYS Performed at [facility], [address], [place], [place] [address]    Report Status PENDING  Incomplete  Surgical PCR screen     Status: None   Collection Time: 12/11/24  9:03 PM   Specimen: Nasal Mucosa; Nasal Swab  Result Value Ref Range Status   MRSA, PCR NEGATIVE NEGATIVE Final   Staphylococcus aureus NEGATIVE NEGATIVE Final    Comment: (NOTE) The Xpert SA Assay (FDA approved for NASAL specimens in patients 65 years of age and older), is one component of a comprehensive surveillance program. It is not  intended to diagnose infection nor to guide or monitor treatment. Performed at [facility], [address] [place], [place] [address]   Aerobic/Anaerobic Culture w Gram Stain (surgical/deep wound)     Status: None (Preliminary result)   Collection Time: 12/12/24  9:10 AM   Specimen: PATH Digit amputation; Tissue  Result Value Ref Range Status   Specimen Description TOE  Final   Special Requests LT TOE AMPUTATION TISSUE  Final   Gram Stain   Final    RARE WBC SEEN FEW GRAM NEGATIVE RODS RARE GRAM POSITIVE RODS RARE GRAM POSITIVE COCCI    Culture   Final    CULTURE REINCUBATED FOR BETTER GROWTH Performed at [facility], [address], [place], [place] [address]    Report Status PENDING  Incomplete   Labs: CBC: Recent Labs  Lab 12/10/24 1915 12/11/24 2050 12/12/24 0610 12/13/24 0849  WBC 9.2 7.4 6.4 6.4  NEUTROABS 6.1  --   --   --   HGB 12.6* 12.9* 12.4* 11.8*  HCT 36.9* 37.0* 35.6* 35.1*  MCV 82.6 82.6 82.4 84.2  PLT 333 315 300 329   Basic Metabolic Panel: Recent Labs  Lab 12/10/24 1915 12/11/24 2050 12/12/24 0610 12/13/24 0849  NA 134*   --  132* 134*  K 3.9  --  3.9 3.9  CL 92*  --  96* 100  CO2 23  --  25 27  GLUCOSE 241*  --  315* 245*  BUN 8  --  7 6  CREATININE 0.74 0.69 0.66 0.59*  CALCIUM 10.0  --  8.8* 8.9  MG  --   --   --  1.8   Liver Function Tests: Recent Labs  Lab 12/10/24 1915  AST 17  ALT 18  ALKPHOS 90  BILITOT 0.6  PROT 9.9*  ALBUMIN 3.5   CBG: Recent Labs  Lab 12/12/24 1338 12/12/24 1548 12/12/24 2051 12/13/24 0806 12/13/24 1251  GLUCAP 201* 302* 292* 248* 194*    Discharge time spent: 35 minutes  Author: Yetta Blanch, MD  [facility] 12/13/2024   "

## 2024-12-13 NOTE — Progress Notes (Signed)
"  °  Subjective:  Patient ID: Danny Crawford, male    DOB: Apr 30, 1991,  MRN: [id number]  Chief Complaint  Patient presents with   Toe Injury   Wound Check    DOS: 12/12/24 Procedure: Left great toe amputation at MPJ level  33 y.o. male seen for post op check. He reports doing well, worked with PT, denies pain, discussed possible dc home today and follow up plans/ aftercare.   Review of Systems: Negative except as noted in the HPI. Denies N/V/F/Ch.   Objective:   Constitutional Well developed. Well nourished.  Vascular Foot warm and well perfused. Capillary refill normal to all digits.   No calf pain with palpation  Neurologic Normal speech. Oriented to person, place, and time. Epicritic sensation diminished to Left foot  Dermatologic Small dried strikethrough on dsg so it was changed. Amp site well coapted and dry no evidence necrosis maceration or residual infection   Orthopedic: S/p L great to amp at MPJ level   Radiographs: s/p L hallux amp at MPJ level  Pathology: pending  Micro: FEW GRAM NEGATIVE RODS  RARE GRAM POSITIVE RODS  RARE GRAM POSITIVE COCCI   Assessment:   1. Other acute osteomyelitis of left foot (HCC)   Osteomyelitis left great toe s/p amputation at MPJ level  Plan:  Patient was evaluated and treated and all questions answered.  POD # 1 s/p L great toe amputation at MPJ level -Progressing well post op, amp site appears clean and free of infection/necrosis -XR: expected post op changes -WB Status: WBAT in post op shoe -Sutures: remain intac. -Medications/ABX: rec 5 days augmentin  and doxycyline on discharge, dcd iv abx -Dressing: changed today, leave dry and intact until follow up - F/u Plan: stable for dc from my standpoint this afternoon, follow up next week Tuesday, office to call to arrange. Will sign off.         Danny Crawford, DPM [facility] / [facility]  "

## 2024-12-13 NOTE — Progress Notes (Signed)
 Physical Therapy Treatment Patient Details Name: Danny Crawford MRN: [id number] DOB: November 05, 1991 Today's Date: 12/13/2024   History of Present Illness 33 y.o. male admitted 12/27 with osteomyelitis of left great toe. S/p Amputation of left great toe at MPJ level 12/29. PMH: DM II, HTN.    PT Comments  Great progress with functional goals. Navigates stairs at a supervision level using a single crutch and rail similar to home set-up. Minimal WB through Lt foot with transitions. Able to transfer with crutches at a mod I level. Good compliance with WB precautions throughout visit. For longer distances, using bil crutches, pt safely maintains a touch-down to min WB level on Lt using heel of post-op shoe without overt loss of balance. Adequate for d/c from a mobility standpoint when medically cleared. Will continue to follow acutely though.     If plan is discharge home, recommend the following: A little help with walking and/or transfers;A little help with bathing/dressing/bathroom;Assistance with cooking/housework;Assist for transportation;Help with stairs or ramp for entrance   Can travel by private vehicle        Equipment Recommendations  Crutches    Recommendations for Other Services       Precautions / Restrictions Precautions Precautions: Fall Recall of Precautions/Restrictions: Intact Required Braces or Orthoses: Other Brace Other Brace: post op shoe, Lt Restrictions Weight Bearing Restrictions Per Provider Order: Yes LLE Weight Bearing Per Provider Order: Weight bearing as tolerated (for short distances/transfers)     Mobility  Bed Mobility Overal bed mobility: Modified Independent             General bed mobility comments: extra time no assist.    Transfers Overall transfer level: Modified independent Equipment used: Crutches Transfers: Sit to/from Stand Sit to Stand: Modified independent (Device/Increase time)           General transfer comment: Mod I  with crtuches, extra time, good recall of set-up and transitions.    Ambulation/Gait Ambulation/Gait assistance: Supervision Gait Distance (Feet): 325 Feet Assistive device: Crutches Gait Pattern/deviations: Step-to pattern, Antalgic Gait velocity: dec Gait velocity interpretation: 1.31 - 2.62 ft/sec, indicative of limited community ambulator   General Gait Details: Safely maintains minimal weight bearing on Lt with post-op shoe, heel touch down, and use of crutches. Reviewed techniques, gradually improving symmetry and pace. No overt LOB. Required a couple of short standing rest breaks due to UE fatigue.   Stairs Stairs: Yes Stairs assistance: Supervision Stair Management: One rail Left, Step to pattern, Forwards, With crutches Number of Stairs: 4 General stair comments: Single rail on Lt, crutch on Rt. Educated on sequencing. Supervision for safety. Slow and effortful, minimal WB through LLE. Feels more confident after practicing. Good recall of step to pattern and sequencing with crutch. Declines trying bil crutch use. Will be optimally safe with use of single crutch and rail to enter/exit home.   Wheelchair Mobility     Tilt Bed    Modified Rankin (Stroke Patients Only)       Balance Overall balance assessment: Needs assistance Sitting-balance support: No upper extremity supported, Feet supported Sitting balance-Leahy Scale: Good     Standing balance support: Single extremity supported, Reliant on assistive device for balance Standing balance-Leahy Scale: Poor                              Communication Communication Communication: No apparent difficulties  Cognition Arousal: Alert Behavior During Therapy: WFL for tasks assessed/performed   PT -  Cognitive impairments: No apparent impairments                         Following commands: Intact      Cueing Cueing Techniques: Verbal cues  Exercises      General Comments         Pertinent Vitals/Pain Pain Assessment Pain Assessment: No/denies pain    Home Living                          Prior Function            PT Goals (current goals can now be found in the care plan section) Acute Rehab PT Goals Patient Stated Goal: Go home PT Goal Formulation: With patient Time For Goal Achievement: 12/26/24 Potential to Achieve Goals: Good Progress towards PT goals: Progressing toward goals    Frequency    Min 2X/week      PT Plan      Co-evaluation              AM-PAC PT 6 Clicks Mobility   Outcome Measure  Help needed turning from your back to your side while in a flat bed without using bedrails?: None Help needed moving from lying on your back to sitting on the side of a flat bed without using bedrails?: None Help needed moving to and from a bed to a chair (including a wheelchair)?: None Help needed standing up from a chair using your arms (e.g., wheelchair or bedside chair)?: None Help needed to walk in hospital room?: A Little Help needed climbing 3-5 steps with a railing? : A Little 6 Click Score: 22    End of Session Equipment Utilized During Treatment: Gait belt Activity Tolerance: Patient tolerated treatment well Patient left: in bed;with call bell/phone within reach;with bed alarm set;with family/visitor present   PT Visit Diagnosis: Unsteadiness on feet (R26.81);Other abnormalities of gait and mobility (R26.89);Difficulty in walking, not elsewhere classified (R26.2)     Time: 9085-9059 PT Time Calculation (min) (ACUTE ONLY): 26 min  Charges:    $Gait Training: 23-37 mins PT General Charges $$ ACUTE PT VISIT: 1 Visit                     Leontine Roads, PT, DPT [facility]  Rehabilitation Services Physical Therapist Office: [phone] Website: [email or web address]    [name] 12/13/2024, 10:54 AM

## 2024-12-13 NOTE — Plan of Care (Signed)
 " Problem: Education: Goal: Ability to describe self-care measures that may prevent or decrease complications (Diabetes Survival Skills Education) will improve 12/13/2024 0731 by Bradly Devere LABOR, RN Outcome: Progressing 12/13/2024 0731 by Bradly Devere LABOR, RN Outcome: Progressing Goal: Individualized Educational Video(s) 12/13/2024 0731 by Bradly Devere LABOR, RN Outcome: Progressing 12/13/2024 0731 by Bradly Devere LABOR, RN Outcome: Progressing   Problem: Coping: Goal: Ability to adjust to condition or change in health will improve 12/13/2024 0731 by Bradly Devere LABOR, RN Outcome: Progressing 12/13/2024 0731 by Bradly Devere LABOR, RN Outcome: Progressing   Problem: Fluid Volume: Goal: Ability to maintain a balanced intake and output will improve 12/13/2024 0731 by Bradly Devere LABOR, RN Outcome: Progressing 12/13/2024 0731 by Bradly Devere LABOR, RN Outcome: Progressing   Problem: Health Behavior/Discharge Planning: Goal: Ability to identify and utilize available resources and services will improve 12/13/2024 0731 by Bradly Devere LABOR, RN Outcome: Progressing 12/13/2024 0731 by Bradly Devere LABOR, RN Outcome: Progressing Goal: Ability to manage health-related needs will improve 12/13/2024 0731 by Bradly Devere LABOR, RN Outcome: Progressing 12/13/2024 0731 by Bradly Devere LABOR, RN Outcome: Progressing   Problem: Metabolic: Goal: Ability to maintain appropriate glucose levels will improve 12/13/2024 0731 by Bradly Devere LABOR, RN Outcome: Progressing 12/13/2024 0731 by Bradly Devere LABOR, RN Outcome: Progressing   Problem: Nutritional: Goal: Maintenance of adequate nutrition will improve 12/13/2024 0731 by Bradly Devere LABOR, RN Outcome: Progressing 12/13/2024 0731 by Bradly Devere LABOR, RN Outcome: Progressing Goal: Progress toward achieving an optimal weight will improve 12/13/2024 0731 by Bradly Devere LABOR, RN Outcome: Progressing 12/13/2024 0731 by Bradly Devere LABOR, RN Outcome: Progressing   Problem: Skin Integrity: Goal: Risk for  impaired skin integrity will decrease 12/13/2024 0731 by Bradly Devere LABOR, RN Outcome: Progressing 12/13/2024 0731 by Bradly Devere LABOR, RN Outcome: Progressing   Problem: Tissue Perfusion: Goal: Adequacy of tissue perfusion will improve 12/13/2024 0731 by Bradly Devere LABOR, RN Outcome: Progressing 12/13/2024 0731 by Bradly Devere LABOR, RN Outcome: Progressing   Problem: Education: Goal: Knowledge of General Education information will improve Description: Including pain rating scale, medication(s)/side effects and non-pharmacologic comfort measures 12/13/2024 0731 by Bradly Devere LABOR, RN Outcome: Progressing 12/13/2024 0731 by Bradly Devere LABOR, RN Outcome: Progressing   Problem: Health Behavior/Discharge Planning: Goal: Ability to manage health-related needs will improve 12/13/2024 0731 by Bradly Devere LABOR, RN Outcome: Progressing 12/13/2024 0731 by Bradly Devere LABOR, RN Outcome: Progressing   Problem: Clinical Measurements: Goal: Ability to maintain clinical measurements within normal limits will improve 12/13/2024 0731 by Bradly Devere LABOR, RN Outcome: Progressing 12/13/2024 0731 by Bradly Devere LABOR, RN Outcome: Progressing Goal: Will remain free from infection 12/13/2024 0731 by Bradly Devere LABOR, RN Outcome: Progressing 12/13/2024 0731 by Bradly Devere LABOR, RN Outcome: Progressing Goal: Diagnostic test results will improve 12/13/2024 0731 by Bradly Devere LABOR, RN Outcome: Progressing 12/13/2024 0731 by Bradly Devere LABOR, RN Outcome: Progressing Goal: Respiratory complications will improve 12/13/2024 0731 by Bradly Devere LABOR, RN Outcome: Progressing 12/13/2024 0731 by Bradly Devere LABOR, RN Outcome: Progressing Goal: Cardiovascular complication will be avoided 12/13/2024 0731 by Bradly Devere LABOR, RN Outcome: Progressing 12/13/2024 0731 by Bradly Devere LABOR, RN Outcome: Progressing   Problem: Activity: Goal: Risk for activity intolerance will decrease 12/13/2024 0731 by Bradly Devere LABOR, RN Outcome: Progressing 12/13/2024  0731 by Bradly Devere LABOR, RN Outcome: Progressing   Problem: Nutrition: Goal: Adequate nutrition will be maintained 12/13/2024 0731 by Bradly Devere LABOR, RN Outcome: Progressing 12/13/2024 0731 by Bradly Devere LABOR, RN Outcome: Progressing  Problem: Coping: Goal: Level of anxiety will decrease 12/13/2024 0731 by Bradly Devere LABOR, RN Outcome: Progressing 12/13/2024 0731 by Bradly Devere LABOR, RN Outcome: Progressing   Problem: Elimination: Goal: Will not experience complications related to bowel motility 12/13/2024 0731 by Bradly Devere LABOR, RN Outcome: Progressing 12/13/2024 0731 by Bradly Devere LABOR, RN Outcome: Progressing Goal: Will not experience complications related to urinary retention 12/13/2024 0731 by Bradly Devere LABOR, RN Outcome: Progressing 12/13/2024 0731 by Bradly Devere LABOR, RN Outcome: Progressing   Problem: Pain Managment: Goal: General experience of comfort will improve and/or be controlled 12/13/2024 0731 by Bradly Devere LABOR, RN Outcome: Progressing 12/13/2024 0731 by Bradly Devere LABOR, RN Outcome: Progressing   Problem: Safety: Goal: Ability to remain free from injury will improve 12/13/2024 0731 by Bradly Devere LABOR, RN Outcome: Progressing 12/13/2024 0731 by Bradly Devere LABOR, RN Outcome: Progressing   Problem: Skin Integrity: Goal: Risk for impaired skin integrity will decrease 12/13/2024 0731 by Bradly Devere LABOR, RN Outcome: Progressing 12/13/2024 0731 by Bradly Devere LABOR, RN Outcome: Progressing   Problem: Education: Goal: Knowledge of the prescribed therapeutic regimen will improve 12/13/2024 0731 by Bradly Devere LABOR, RN Outcome: Progressing 12/13/2024 0731 by Bradly Devere LABOR, RN Outcome: Progressing   Problem: Bowel/Gastric: Goal: Gastrointestinal status for postoperative course will improve 12/13/2024 0731 by Bradly Devere LABOR, RN Outcome: Progressing 12/13/2024 0731 by Bradly Devere LABOR, RN Outcome: Progressing   Problem: Cardiac: Goal: Ability to maintain an adequate cardiac  output 12/13/2024 0731 by Bradly Devere LABOR, RN Outcome: Progressing 12/13/2024 0731 by Bradly Devere LABOR, RN Outcome: Progressing Goal: Will show no evidence of cardiac arrhythmias 12/13/2024 0731 by Bradly Devere LABOR, RN Outcome: Progressing 12/13/2024 0731 by Bradly Devere LABOR, RN Outcome: Progressing   Problem: Nutritional: Goal: Will attain and maintain optimal nutritional status 12/13/2024 0731 by Bradly Devere LABOR, RN Outcome: Progressing 12/13/2024 0731 by Bradly Devere LABOR, RN Outcome: Progressing   Problem: Neurological: Goal: Will regain or maintain usual level of consciousness 12/13/2024 0731 by Bradly Devere LABOR, RN Outcome: Progressing 12/13/2024 0731 by Bradly Devere LABOR, RN Outcome: Progressing   Problem: Clinical Measurements: Goal: Ability to maintain clinical measurements within normal limits 12/13/2024 0731 by Bradly Devere LABOR, RN Outcome: Progressing 12/13/2024 0731 by Bradly Devere LABOR, RN Outcome: Progressing Goal: Postoperative complications will be avoided or minimized 12/13/2024 0731 by Bradly Devere LABOR, RN Outcome: Progressing 12/13/2024 0731 by Bradly Devere LABOR, RN Outcome: Progressing   Problem: Respiratory: Goal: Will regain and/or maintain adequate ventilation 12/13/2024 0731 by Bradly Devere LABOR, RN Outcome: Progressing 12/13/2024 0731 by Bradly Devere LABOR, RN Outcome: Progressing Goal: Respiratory status will improve 12/13/2024 0731 by Bradly Devere LABOR, RN Outcome: Progressing 12/13/2024 0731 by Bradly Devere LABOR, RN Outcome: Progressing   Problem: Skin Integrity: Goal: Demonstrates signs of wound healing without infection 12/13/2024 0731 by Bradly Devere LABOR, RN Outcome: Progressing 12/13/2024 0731 by Bradly Devere LABOR, RN Outcome: Progressing   Problem: Urinary Elimination: Goal: Will remain free from infection 12/13/2024 0731 by Bradly Devere LABOR, RN Outcome: Progressing 12/13/2024 0731 by Bradly Devere LABOR, RN Outcome: Progressing Goal: Ability to achieve and maintain adequate urine  output 12/13/2024 0731 by Bradly Devere LABOR, RN Outcome: Progressing 12/13/2024 0731 by Bradly Devere LABOR, RN Outcome: Progressing   "

## 2024-12-13 NOTE — Progress Notes (Signed)
 Transition of Care [facility]) - Inpatient Brief Assessment   Patient Details  Name: Danny Crawford MRN: [id number] Date of Birth: 03/11/1991  Transition of [facility]) CM/SW Contact:    Rosaline JONELLE Joe, RN Phone Number: 12/13/2024, 2:12 PM   Clinical Narrative: CM met with the patient prior to discharge home.  The patient lives at home with spouse and plans to return home when stable.  The patient is diabetic and does not have glucometer at home.  Patient has BCBS but it will not be effective until December 15, 2024.  I spoke with the patient and he is aware how to use glucometer per teaching by diabetes coordinator.  Discharge information provided in the AVS.  Patient states that he is a non-smoker as well.  I asked MD send patient's medications to [facility] pharmacy.  Patient has no insurance so MATCH was placed to assist with cost of medications for home.  Patient has crutches and post-operative shoe in the hospital room to take home.  The patient's wife will be providing transportation to home via car.     Transition of Care Asessment: Insurance and Status: (P) Insurance coverage has been reviewed Patient has primary care physician: (P) Yes Home environment has been reviewed: (P) from home with spouse Prior level of function:: (P) self Prior/Current Home Services: (P) No current home services Social Drivers of Health Review: (P) SDOH reviewed interventions complete Readmission risk has been reviewed: (P) Yes Transition of care needs: (P) transition of care needs identified, TOC will continue to follow

## 2024-12-13 NOTE — Plan of Care (Signed)

## 2024-12-13 NOTE — Progress Notes (Signed)
 " MATCH Medication Assistance Card *Pharmacies please call [phone] for claim processing assistance Name: Danny Crawford                                                                                                                                                                                  Relationship Code:  1 ID (MRN): [id number]                                                                                                                                                                                Person Code:  01 Bin: [id number] RX Group: [id number] Discharge Date: 12/13/2024 RX PCN:  [id number] Expiration Date:12/20/2024                                          (must be filled within 7 days of discharge)     You have been approved to have the prescriptions written by your discharging physician filled through our [facility] (Medication Assistance Through [facility]) program. This program allows for a one-time (no refills) 34-day supply of selected medications for a low copay amount.  The copay is $0 per prescription.   Only certain pharmacies are participating in this program with [facility]. You will need to select one of the pharmacies from the attached list and take your prescriptions, this letter, and your photo ID to one of  the [facility]patient pharmacies.  We are excited that you are able to use the [facility] program to get your medications. These prescriptions must be filled within 7 days of hospital discharge or they will no longer be valid for the [facility] program. Should you have any problems with your prescriptions please contact your case management team member at [phone] for [place]/  [facility].  Thank you,   [facility]   "

## 2024-12-14 ENCOUNTER — Other Ambulatory Visit: Payer: Self-pay | Admitting: Podiatry

## 2024-12-14 ENCOUNTER — Other Ambulatory Visit (HOSPITAL_COMMUNITY): Payer: Self-pay

## 2024-12-14 LAB — ACID FAST SMEAR (AFB, MYCOBACTERIA): Acid Fast Smear: NEGATIVE

## 2024-12-14 MED ORDER — CIPROFLOXACIN HCL 500 MG PO TABS: 500.0000 mg | ORAL_TABLET | Freq: Two times a day (BID) | ORAL | 0 refills | Status: AC

## 2024-12-14 NOTE — Progress Notes (Signed)
 Changed abx give cultures

## 2024-12-15 LAB — SURGICAL PATHOLOGY

## 2024-12-16 LAB — CULTURE, BLOOD (ROUTINE X 2)
Culture: NO GROWTH
Culture: NO GROWTH
Special Requests: ADEQUATE
Special Requests: ADEQUATE

## 2024-12-19 LAB — AEROBIC/ANAEROBIC CULTURE W GRAM STAIN (SURGICAL/DEEP WOUND)

## 2024-12-20 ENCOUNTER — Encounter: Payer: Self-pay | Admitting: Podiatry

## 2024-12-20 ENCOUNTER — Encounter: Admitting: Podiatry

## 2024-12-20 DIAGNOSIS — Z9889 Other specified postprocedural states: Secondary | ICD-10-CM

## 2024-12-20 DIAGNOSIS — M869 Osteomyelitis, unspecified: Secondary | ICD-10-CM

## 2024-12-20 NOTE — Progress Notes (Signed)
"  °  Subjective:  Patient ID: Danny Crawford, male    DOB: 10/23/1991,  MRN: [id number]  Chief Complaint  Patient presents with   Routine Post Op    Left great toe amputation. 0 pain. IDDM A1C 13. Completed Amoxicillin .    DOS: 12/12/24 Procedure: Left great toe amputation at MPJ level  34 y.o. male seen for post op check. He reports doing well at home since leaving hospital took abx, left dressing c/d/I as instructed. Denies pain does have some strange nerve sensations in left foot.  Review of Systems: Negative except as noted in the HPI. Denies N/V/F/Ch.   Objective:   Constitutional Well developed. Well nourished.  Vascular Foot warm and well perfused. Capillary refill normal to all digits.   No calf pain with palpation  Neurologic Normal speech. Oriented to person, place, and time. Epicritic sensation diminished to Left foot  Dermatologic  Amp site well coapted and dry no evidence necrosis maceration or residual infection, healing well nearly ready for suture to come out   Orthopedic: S/p L great to amp at MPJ level   Radiographs: s/p L hallux amp at MPJ level  Pathology: pending  Micro: FEW GRAM NEGATIVE RODS  RARE GRAM POSITIVE RODS  RARE GRAM POSITIVE COCCI   Assessment:   1. Osteomyelitis of great toe of left foot (HCC)   2. Post-operative state    Osteomyelitis left great toe s/p amputation at MPJ level  Plan:  Patient was evaluated and treated and all questions answered.  1 week  s/p L great toe amputation at MPJ level -Progressing well post op, amp site healing well at this time no evidence reisdual infection/ wound complicaiton -XR: expected post op changes -WB Status: WBAT in post op shoe -Sutures: remain intac. -Medications/ABX: s/p 5 days augmentin  and doxycyline on discharge  -Dressing: changed today, leave dry and intact until follow up - F/u Plan: f/u 1 week for suture removal        [name], DPM [facility] / [facility]  "

## 2024-12-27 ENCOUNTER — Ambulatory Visit (INDEPENDENT_AMBULATORY_CARE_PROVIDER_SITE_OTHER): Admitting: Podiatry

## 2024-12-27 DIAGNOSIS — Z9889 Other specified postprocedural states: Secondary | ICD-10-CM | POA: Diagnosis not present

## 2024-12-27 DIAGNOSIS — M869 Osteomyelitis, unspecified: Secondary | ICD-10-CM

## 2024-12-27 NOTE — Progress Notes (Signed)
"  °  Subjective:  Patient ID: Danny Crawford, male    DOB: [birth year],  MRN: [id number]  No chief complaint on file.   DOS: 12/12/24 Procedure: Left great toe amputation at MPJ level  34 y.o. male seen for post op check.  Patient reports he is been doing well left dressing intact as instructed since last appointment has been walking postop shoe says his pain is controlled asked about when he can go back to work/wear work boots  Review of Systems: Negative except as noted in the HPI. Denies N/V/F/Ch.   Objective:   Constitutional Well developed. Well nourished.  Vascular Foot warm and well perfused. Capillary refill normal to all digits.   No calf pain with palpation  Neurologic Normal speech. Oriented to person, place, and time. Epicritic sensation diminished to Left foot  Dermatologic  Amp site well coapted and dry no evidence necrosis maceration or residual infection    Orthopedic: S/p L great to amp at MPJ level   Radiographs: s/p L hallux amp at MPJ level  Pathology: pending  Micro: FEW GRAM NEGATIVE RODS  RARE GRAM POSITIVE RODS  RARE GRAM POSITIVE COCCI   Assessment:   1. Osteomyelitis of great toe of left foot (HCC)   2. Post-operative state     Osteomyelitis left great toe s/p amputation at MPJ level  Plan:  Patient was evaluated and treated and all questions answered.  2 week  s/p L great toe amputation at MPJ level -Progressing well post op, amp site healing well at this time no evidence reisdual infection/ wound complicaiton -XR: expected post op changes -WB Status: WBAT in post op shoe for another week and then okay to transition back to regular shoe gear -Sutures: Removed in total today -Medications/ABX: No further antibiotics indicated -Dressing: changed today, leave dry and intact until follow up - F/u Plan: f/u 3 weeks for final recheck        [name], DPM [facility] / [facility]  "

## 2025-01-03 ENCOUNTER — Telehealth: Payer: Self-pay | Admitting: Podiatry

## 2025-01-03 DIAGNOSIS — Z0279 Encounter for issue of other medical certificate: Secondary | ICD-10-CM

## 2025-01-03 NOTE — Telephone Encounter (Signed)
 s/w Danny Crawford and she will pay 50.00 01/05/25 for her forms/pt's. RTW approx 01/23/25. Faxing pt's [employer] [phone] and Melia Clore for [employer] [phone] and email her copy of forms for their records.

## 2025-01-04 DIAGNOSIS — Z0279 Encounter for issue of other medical certificate: Secondary | ICD-10-CM

## 2025-01-11 ENCOUNTER — Encounter: Payer: Self-pay | Admitting: Podiatry

## 2025-01-11 ENCOUNTER — Telehealth: Payer: Self-pay | Admitting: Podiatry

## 2025-01-11 ENCOUNTER — Encounter (HOSPITAL_BASED_OUTPATIENT_CLINIC_OR_DEPARTMENT_OTHER): Payer: Self-pay | Admitting: Family Medicine

## 2025-01-11 NOTE — Telephone Encounter (Signed)
 See MyChart note.

## 2025-01-12 ENCOUNTER — Ambulatory Visit: Payer: Self-pay | Admitting: Primary Care

## 2025-01-12 ENCOUNTER — Ambulatory Visit (HOSPITAL_BASED_OUTPATIENT_CLINIC_OR_DEPARTMENT_OTHER): Payer: Self-pay | Admitting: Family Medicine

## 2025-01-12 ENCOUNTER — Encounter: Payer: Self-pay | Admitting: Podiatry

## 2025-01-12 NOTE — Telephone Encounter (Signed)
 lft mess to adv pt Dr. Malvin said ok to rtw now. I will send mess MyChart to confirm what date to put on note and email it to email

## 2025-01-12 NOTE — Telephone Encounter (Signed)
 Emailed work note to pt for RTW 01/22/25

## 2025-01-12 NOTE — Telephone Encounter (Signed)
 I sent mess to Dr. Malvin to see if pt can go back to work now or wait till after next visit. I will let pt know and do another note if ok

## 2025-01-13 ENCOUNTER — Ambulatory Visit
Admission: RE | Admit: 2025-01-13 | Discharge: 2025-01-13 | Disposition: A | Discharge status: Home / Self Care | Payer: Self-pay | Source: Ambulatory Visit | Attending: Family Medicine

## 2025-01-13 VITALS — BP 134/88 | HR 94 | Temp 98.7°F | Resp 17

## 2025-01-13 DIAGNOSIS — E119 Type 2 diabetes mellitus without complications: Secondary | ICD-10-CM | POA: Diagnosis not present

## 2025-01-13 DIAGNOSIS — Z794 Long term (current) use of insulin: Secondary | ICD-10-CM

## 2025-01-13 DIAGNOSIS — Z760 Encounter for issue of repeat prescription: Secondary | ICD-10-CM

## 2025-01-13 LAB — FUNGUS CULTURE WITH STAIN

## 2025-01-13 LAB — FUNGAL ORGANISM REFLEX

## 2025-01-13 LAB — FUNGUS CULTURE RESULT

## 2025-01-13 MED ORDER — METFORMIN HCL 1000 MG PO TABS: 1000.0000 mg | ORAL_TABLET | Freq: Two times a day (BID) | ORAL | 0 refills | Status: AC

## 2025-01-13 MED ORDER — GLUCOSE BLOOD VI STRP: ORAL_STRIP | 0 refills | Status: AC

## 2025-01-13 NOTE — ED Triage Notes (Signed)
 Pt requested Metformin  1000 MG and glucose monitor supply refill.

## 2025-01-13 NOTE — ED Provider Notes (Signed)
 " [facility]  Note:  This document was prepared using Conservation officer, historic buildings and may include unintentional dictation errors.  MRN: [id number] DOB: 07-17-1991  Subjective:   Danny Crawford is a 34 y.o. male presenting for medication refill of his metformin  and glucometer strips.  Patient has been very judicious with his health care, is taking his insulin  and checking his blood sugar regularly.  Practices diabetic friendly diet.  Last blood sugar checks were 117 and 122 over the past couple of days.  Patient is largely asymptomatic.  Current Outpatient Medications  Medication Instructions   Accu-Chek Softclix Lancets lancets Use up to four times daily as directed. (FOR ICD-10 E10.9, E11.9).   Basaglar  KwikPen 22 Units, Subcutaneous, Daily at bedtime, May substitute as needed per insurance.   Blood Glucose Monitoring Suppl (BLOOD GLUCOSE MONITOR SYSTEM) w/Device KIT Use up to four times daily as directed. (FOR ICD-10 E10.9, E11.9).   glucagon  Emergency 1 mg, Subcutaneous, As needed   Glucose Blood (BLOOD GLUCOSE TEST STRIPS) STRP Use up to four times daily as directed. (FOR ICD-10 E10.9, E11.9).   HYDROcodone -acetaminophen  (NORCO/VICODIN) 5-325 MG tablet 1 tablet, Oral, Every 6 hours PRN   Insulin  Pen Needle 32G X 4 MM MISC Use up to four times daily as directed. (FOR ICD-10 E10.9, E11.9).   Lancet Device MISC 1 each, Does not apply, As directed, Dispense based on patient and insurance preference. Use up to four times daily as directed. (FOR ICD-10 E10.9, E11.9).   metFORMIN  (GLUCOPHAGE ) 1,000 mg, Oral, 2 times daily with meals   NovoLOG  FlexPen 4 Units, Subcutaneous, 3 times daily with meals, Only take if eating a meal AND Blood Glucose (BG) is 80 or higher.   senna-docusate (SENOKOT-S) 8.6-50 MG tablet 1 tablet, Oral, At bedtime PRN    Allergies[1]  Past Medical History:  Diagnosis Date   Diabetes mellitus without complication (HCC)    Hypertension       Past Surgical History:  Procedure Laterality Date   AMPUTATION TOE Left 12/12/2024   Procedure: AMPUTATION, TOE;  Surgeon: Malvin Marsa FALCON, DPM;  Location: [facility] OR;  Service: Orthopedics/Podiatry;  Laterality: Left;  LEFT GREAT TOE AMPUTATION    Family History  Problem Relation Age of Onset   Diabetes Father    Hypertension Father     Social History   Occupational History   Not on file  Tobacco Use   Smoking status: Former   Smokeless tobacco: Never  Vaping Use   Vaping status: Never Used  Substance and Sexual Activity   Alcohol use: Yes    Comment: socially   Drug use: No   Sexual activity: Yes     ROS   Objective:   Vitals: BP 134/88 (BP Location: Right Arm)   Pulse 94   Temp 98.7 F (37.1 C) (Oral)   Resp 17   SpO2 97%   Physical Exam Constitutional:      General: He is not in acute distress.    Appearance: Normal appearance. He is well-developed and normal weight. He is not ill-appearing, toxic-appearing or diaphoretic.  HENT:     Head: Normocephalic and atraumatic.     Right Ear: External ear normal.     Left Ear: External ear normal.     Nose: Nose normal.     Mouth/Throat:     Pharynx: Oropharynx is clear.  Eyes:     General: No scleral icterus.       Right eye: No discharge.  Left eye: No discharge.     Extraocular Movements: Extraocular movements intact.  Cardiovascular:     Rate and Rhythm: Normal rate.  Pulmonary:     Effort: Pulmonary effort is normal.  Musculoskeletal:     Cervical back: Normal range of motion.  Neurological:     Mental Status: He is alert and oriented to person, place, and time.  Psychiatric:        Mood and Affect: Mood normal.        Behavior: Behavior normal.        Thought Content: Thought content normal.        Judgment: Judgment normal.     Assessment and Plan :   PDMP not reviewed this encounter.  1. Encounter for medication refill   2. Type 2 diabetes mellitus treated with insulin   (HCC)      Refills provided for his metformin  and glucometer strips.  Emphasized need to establish care with PCP for recheck and future refills.    [1]  Allergies Allergen Reactions   Shellfish Allergy Anaphylaxis and Swelling    Face and throat swell     Danny Savannah, PA-C 01/13/25 1158  "

## 2025-01-17 ENCOUNTER — Ambulatory Visit (INDEPENDENT_AMBULATORY_CARE_PROVIDER_SITE_OTHER): Admitting: Podiatry

## 2025-01-17 DIAGNOSIS — M869 Osteomyelitis, unspecified: Secondary | ICD-10-CM

## 2025-01-17 DIAGNOSIS — Z9889 Other specified postprocedural states: Secondary | ICD-10-CM | POA: Diagnosis not present

## 2025-01-17 NOTE — Progress Notes (Signed)
"  °  Subjective:  Patient ID: Danny Crawford, male    DOB: Apr 06, 1991,  MRN: [id number]  Chief Complaint  Patient presents with   Routine Post Op    Left great toe amputation at MPJ level. 0 pain. Wearing regular shoes. IDDM A1C 13.0.    DOS: 12/12/24 Procedure: Left great toe amputation at MPJ level  34 y.o. male seen for post op check.  Patient reports he is been doing well denies drainage or redness.  Notes some small amount of residual swelling at the site.  Wearing regular shoes.  Doing okay in regards to balance.  Going back to work next week  Review of Systems: Negative except as noted in the HPI. Denies N/V/F/Ch.   Objective:   Constitutional Well developed. Well nourished.  Vascular Foot warm and well perfused. Capillary refill normal to all digits.   No calf pain with palpation  Neurologic Normal speech. Oriented to person, place, and time. Epicritic sensation diminished to Left foot  Dermatologic  Amp site well coapted.  There is healed at this time    Orthopedic: S/p L great to amp at MPJ level   Radiographs: s/p L hallux amp at MPJ level  Pathology: A. TOE, LEFT GREAT, AMPUTATION:  - Skin with ulceration, associated necrotizing inflammation and acute  osteomyelitis  - Bone at the resection margin does not show evidence of acute  osteomyelitis   Micro: FEW GRAM NEGATIVE RODS  RARE GRAM POSITIVE RODS  RARE GRAM POSITIVE COCCI   Assessment:   1. Osteomyelitis of great toe of left foot (HCC)   2. Post-operative state    Osteomyelitis left great toe s/p amputation at MPJ level  Plan:  Patient was evaluated and treated and all questions answered.  4 week  s/p L great toe amputation at MPJ level -Progressing well post op, amp site fully healed no evidence of any issue -XR: expected post op changes -WB Status: Regular shoe gear weightbearing as tolerated -Sutures: Previously removed -Medications/ABX: No further antibiotics indicated -Dressing: No further  dressing care needed to wash as normal - F/u Plan: Follow-up as needed        [name], DPM [facility]  "

## 2025-01-18 ENCOUNTER — Ambulatory Visit: Payer: Self-pay | Admitting: Primary Care

## 2025-02-08 ENCOUNTER — Ambulatory Visit (HOSPITAL_BASED_OUTPATIENT_CLINIC_OR_DEPARTMENT_OTHER): Payer: Self-pay | Admitting: Family Medicine
